# Patient Record
Sex: Male | Born: 1945 | Race: Black or African American | Hispanic: No | Smoking: Former smoker
Health system: Southern US, Community
[De-identification: ages and names within clinical notes are randomized; demographics above are authoritative.]

## PROBLEM LIST (undated history)

## (undated) DIAGNOSIS — I4891 Unspecified atrial fibrillation: Secondary | ICD-10-CM

## (undated) DIAGNOSIS — I10 Essential (primary) hypertension: Secondary | ICD-10-CM

## (undated) DIAGNOSIS — I639 Cerebral infarction, unspecified: Secondary | ICD-10-CM

## (undated) DIAGNOSIS — I509 Heart failure, unspecified: Secondary | ICD-10-CM

---

## 2019-11-23 ENCOUNTER — Other Ambulatory Visit: Payer: Self-pay

## 2019-11-23 ENCOUNTER — Inpatient Hospital Stay
Admission: EM | Admit: 2019-11-23 | Discharge: 2019-11-25 | DRG: 291 | Discharge status: Against Medical Advice | Payer: Medicare Other | Attending: Internal Medicine | Admitting: Internal Medicine

## 2019-11-23 ENCOUNTER — Encounter: Payer: Self-pay | Admitting: Emergency Medicine

## 2019-11-23 ENCOUNTER — Emergency Department: Payer: Medicare Other

## 2019-11-23 DIAGNOSIS — Z880 Allergy status to penicillin: Secondary | ICD-10-CM

## 2019-11-23 DIAGNOSIS — I428 Other cardiomyopathies: Secondary | ICD-10-CM | POA: Diagnosis present

## 2019-11-23 DIAGNOSIS — E859 Amyloidosis, unspecified: Secondary | ICD-10-CM | POA: Diagnosis present

## 2019-11-23 DIAGNOSIS — Z7901 Long term (current) use of anticoagulants: Secondary | ICD-10-CM

## 2019-11-23 DIAGNOSIS — Z5329 Procedure and treatment not carried out because of patient's decision for other reasons: Secondary | ICD-10-CM | POA: Diagnosis not present

## 2019-11-23 DIAGNOSIS — E876 Hypokalemia: Secondary | ICD-10-CM

## 2019-11-23 DIAGNOSIS — R778 Other specified abnormalities of plasma proteins: Secondary | ICD-10-CM | POA: Diagnosis present

## 2019-11-23 DIAGNOSIS — Z87891 Personal history of nicotine dependence: Secondary | ICD-10-CM | POA: Diagnosis not present

## 2019-11-23 DIAGNOSIS — I509 Heart failure, unspecified: Secondary | ICD-10-CM

## 2019-11-23 DIAGNOSIS — Z20822 Contact with and (suspected) exposure to covid-19: Secondary | ICD-10-CM | POA: Diagnosis present

## 2019-11-23 DIAGNOSIS — Z8673 Personal history of transient ischemic attack (TIA), and cerebral infarction without residual deficits: Secondary | ICD-10-CM

## 2019-11-23 DIAGNOSIS — I712 Thoracic aortic aneurysm, without rupture: Secondary | ICD-10-CM | POA: Diagnosis present

## 2019-11-23 DIAGNOSIS — I110 Hypertensive heart disease with heart failure: Secondary | ICD-10-CM | POA: Diagnosis present

## 2019-11-23 DIAGNOSIS — I5023 Acute on chronic systolic (congestive) heart failure: Secondary | ICD-10-CM

## 2019-11-23 DIAGNOSIS — J9601 Acute respiratory failure with hypoxia: Secondary | ICD-10-CM | POA: Diagnosis present

## 2019-11-23 DIAGNOSIS — I482 Chronic atrial fibrillation, unspecified: Secondary | ICD-10-CM | POA: Diagnosis present

## 2019-11-23 DIAGNOSIS — I10 Essential (primary) hypertension: Secondary | ICD-10-CM

## 2019-11-23 DIAGNOSIS — M7989 Other specified soft tissue disorders: Secondary | ICD-10-CM

## 2019-11-23 DIAGNOSIS — R601 Generalized edema: Secondary | ICD-10-CM

## 2019-11-23 DIAGNOSIS — I7121 Aneurysm of the ascending aorta, without rupture: Secondary | ICD-10-CM

## 2019-11-23 HISTORY — DX: Cerebral infarction, unspecified: I63.9

## 2019-11-23 HISTORY — DX: Unspecified atrial fibrillation: I48.91

## 2019-11-23 HISTORY — DX: Essential (primary) hypertension: I10

## 2019-11-23 HISTORY — DX: Heart failure, unspecified: I50.9

## 2019-11-23 LAB — BASIC METABOLIC PANEL
Anion gap: 9 (ref 5–15)
BUN: 20 mg/dL (ref 8–23)
CO2: 27 mmol/L (ref 22–32)
Calcium: 8.7 mg/dL — ABNORMAL LOW (ref 8.9–10.3)
Chloride: 108 mmol/L (ref 98–111)
Creatinine, Ser: 1.17 mg/dL (ref 0.61–1.24)
GFR calc Af Amer: >60 mL/min (ref >60)
GFR calc non Af Amer: >60 mL/min (ref >60)
Glucose, Bld: 113 mg/dL — ABNORMAL HIGH (ref 70–99)
Potassium: 2.8 mmol/L — ABNORMAL LOW (ref 3.5–5.1)
Sodium: 144 mmol/L (ref 135–145)

## 2019-11-23 LAB — HEPATIC FUNCTION PANEL
ALT: 21 U/L (ref 0–44)
AST: 39 U/L (ref 15–41)
Albumin: 3.4 g/dL — ABNORMAL LOW (ref 3.5–5.0)
Alkaline Phosphatase: 105 U/L (ref 38–126)
Bilirubin, Direct: 1.2 mg/dL — ABNORMAL HIGH (ref 0.0–0.2)
Indirect Bilirubin: 1.9 mg/dL — ABNORMAL HIGH (ref 0.3–0.9)
Total Bilirubin: 3.1 mg/dL — ABNORMAL HIGH (ref 0.3–1.2)
Total Protein: 7.2 g/dL (ref 6.5–8.1)

## 2019-11-23 LAB — CBC
HCT: 36.6 % — ABNORMAL LOW (ref 39.0–52.0)
Hemoglobin: 11.4 g/dL — ABNORMAL LOW (ref 13.0–17.0)
MCH: 26.3 pg (ref 26.0–34.0)
MCHC: 31.1 g/dL (ref 30.0–36.0)
MCV: 84.3 fL (ref 80.0–100.0)
Platelets: 205 10*3/uL (ref 150–400)
RBC: 4.34 MIL/uL (ref 4.22–5.81)
RDW: 16.7 % — ABNORMAL HIGH (ref 11.5–15.5)
WBC: 5.6 10*3/uL (ref 4.0–10.5)
nRBC: 0 % (ref 0.0–0.2)

## 2019-11-23 LAB — URINALYSIS, COMPLETE (UACMP) WITH MICROSCOPIC
Bacteria, UA: NONE SEEN
Glucose, UA: NEGATIVE mg/dL
Ketones, ur: NEGATIVE mg/dL
Nitrite: NEGATIVE
Protein, ur: 30 mg/dL — AB
RBC / HPF: >50 RBC/hpf — ABNORMAL HIGH (ref 0–5)
Specific Gravity, Urine: 1.039 — ABNORMAL HIGH (ref 1.005–1.030)
pH: 5 (ref 5.0–8.0)

## 2019-11-23 LAB — TROPONIN I (HIGH SENSITIVITY)
Troponin I (High Sensitivity): 234 ng/L (ref <18)
Troponin I (High Sensitivity): 251 ng/L (ref <18)
Troponin I (High Sensitivity): 251 ng/L (ref <18)

## 2019-11-23 LAB — BRAIN NATRIURETIC PEPTIDE: B Natriuretic Peptide: 809 pg/mL — ABNORMAL HIGH (ref 0.0–100.0)

## 2019-11-23 LAB — PROTIME-INR
INR: 1 (ref 0.8–1.2)
Prothrombin Time: 12.6 seconds (ref 11.4–15.2)

## 2019-11-23 LAB — TSH: TSH: 3.656 u[IU]/mL (ref 0.350–4.500)

## 2019-11-23 MED ORDER — SODIUM CHLORIDE 0.9% FLUSH
3.0000 mL | Freq: Two times a day (BID) | INTRAVENOUS | Status: DC
  Administered 2019-11-23 – 2019-11-25 (×4): 3 mL via INTRAVENOUS

## 2019-11-23 MED ORDER — SODIUM CHLORIDE 0.9% FLUSH: 3.0000 mL | INTRAVENOUS | Status: DC | PRN

## 2019-11-23 MED ORDER — ONDANSETRON HCL 4 MG/2ML IJ SOLN: 4.0000 mg | Freq: Four times a day (QID) | INTRAMUSCULAR | Status: DC | PRN

## 2019-11-23 MED ORDER — SODIUM CHLORIDE 0.9 % IV SOLN: INTRAVENOUS | Status: DC | PRN

## 2019-11-23 MED ORDER — SODIUM CHLORIDE 0.9 % IV SOLN: 250.0000 mL | INTRAVENOUS | Status: DC | PRN

## 2019-11-23 MED ORDER — POTASSIUM CHLORIDE CRYS ER 20 MEQ PO TBCR
40.0000 meq | EXTENDED_RELEASE_TABLET | Freq: Two times a day (BID) | ORAL | Status: DC
  Administered 2019-11-23 – 2019-11-25 (×3): 40 meq via ORAL
  Filled 2019-11-23 (×4): qty 2

## 2019-11-23 MED ORDER — SODIUM CHLORIDE 0.9 % IV SOLN: Freq: Once | INTRAVENOUS | Status: DC

## 2019-11-23 MED ORDER — ACETAMINOPHEN 325 MG PO TABS: 650.0000 mg | ORAL_TABLET | ORAL | Status: DC | PRN

## 2019-11-23 MED ORDER — FUROSEMIDE 10 MG/ML IJ SOLN
40.0000 mg | Freq: Once | INTRAMUSCULAR | Status: AC
  Administered 2019-11-23: 40 mg via INTRAVENOUS
  Filled 2019-11-23: qty 4

## 2019-11-23 MED ORDER — ASPIRIN 81 MG PO CHEW
324.0000 mg | CHEWABLE_TABLET | Freq: Once | ORAL | Status: AC
  Administered 2019-11-23: 18:00:00 324 mg via ORAL
  Filled 2019-11-23: qty 4

## 2019-11-23 MED ORDER — IOHEXOL 350 MG/ML SOLN
125.0000 mL | Freq: Once | INTRAVENOUS | Status: AC | PRN
  Administered 2019-11-23: 125 mL via INTRAVENOUS

## 2019-11-23 MED ORDER — POTASSIUM CHLORIDE CRYS ER 20 MEQ PO TBCR
40.0000 meq | EXTENDED_RELEASE_TABLET | Freq: Once | ORAL | Status: AC
  Administered 2019-11-23: 40 meq via ORAL
  Filled 2019-11-23: qty 2

## 2019-11-23 MED ORDER — POTASSIUM CHLORIDE 10 MEQ/100ML IV SOLN
10.0000 meq | INTRAVENOUS | Status: AC
  Administered 2019-11-23 (×3): 10 meq via INTRAVENOUS
  Filled 2019-11-23 (×3): qty 100

## 2019-11-23 MED ORDER — FUROSEMIDE 10 MG/ML IJ SOLN
60.0000 mg | Freq: Two times a day (BID) | INTRAMUSCULAR | Status: DC
  Administered 2019-11-24 – 2019-11-25 (×3): 60 mg via INTRAVENOUS
  Filled 2019-11-23 (×3): qty 6

## 2019-11-23 NOTE — ED Notes (Signed)
Transported to CT 

## 2019-11-23 NOTE — ED Notes (Signed)
Pt reports hearing voices that are loud to RN.  Asking if RN hearing same voices.  Reports has been hearing voices that no one else hears.  Dr Silverio Lay notified.

## 2019-11-23 NOTE — ED Triage Notes (Addendum)
Pt arrived via POV with reports of right leg pain, states "it gives out".  Pt also reports having excess fluid build-up as well and wants evaluated. Pt denies any shortness of breath.   Pt recently admitted for Anasarca, states he still has scrotal swelling, but states the swelling has subsided.

## 2019-11-23 NOTE — H&P (Signed)
History and Physical    Franklin Obrien ZJQ:[id number] DOB: [birth year] DOA: 11/23/2019  PCP: Patient, No Pcp Per   Patient coming from: home I have personally briefly reviewed patient's old medical records in [place]  Chief Complaint: fluid build up  HPI: Franklin Obrien is a 74 y.o. 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 fib on Eliquis, with history of multiple hospitalizations for fluid overload/anasarca at [facility], most recently February 2021 with EF 35 to 40% with elevated troponin around 200, treated as demand ischemia from heart failure exacerbation, who presents to the emergency room with a complaint of increased swelling in the right arm and legs. chartreview reveals that it has been a recurrent problem for patient and has been attributed to his fluid buildup.  He denies orthopnea and is lying flat comfortably.  Patient denies shortness of breath, denies chest pain, denies nausea vomiting, palpitations lightheadedness  ED Course: On arrival in the emergency room he was afebrile with BP 133/82, HR 82, O2 sat 98% on room air.  Troponin was elevated at 234>>251 (recently at [facility]200>>240), BNP elevated at 809, indirect bili 1.9.  Potassium 2.8.-0.4.  Blood work otherwise unremarkable.  Urinalysis not consistent with UTI.  Chest x-ray showed cardiomegaly with pulmonary vascular congestion as Doppler of the right upper extremity that was negative for DVT.  CT head showed no acute intracranial findings.  He had a CT angiogram of the aorta that showed diffuse anasarca with small bilateral pleural effusions, normal caliber abdominal aorta without aneurysm or dissection.  Patient was treated with IV Lasix.  Hospitalist consulted for admission  Review of Systems: As per HPI otherwise 10 point review of systems negative.    Past Medical History:  Diagnosis Date  . A-fib ([place])    . CHF (congestive heart failure) ([place])   . Hypertension   . Stroke [facility])     History as mentioned in HPI   reports that he has quit smoking. He has never used smokeless tobacco. No history on file for alcohol and drug.  Allergies  Allergen Reactions  . Doxycycline     Other reaction(s): Other (See Comments) Syncope and collapse  . Penicillin G Rash    No family history on file.   Prior to Admission medications   Not on File    Physical Exam: Vitals:   11/23/19 1538 11/23/19 1609 11/23/19 1800 11/23/19 1830  BP: 133/82  109/62 127/73  Pulse: 82   80  Resp: 16  (!) 24 (!) 25  Temp: 97.8 F (36.6 C)     TempSrc: Oral     SpO2: 98%  97% 100%  Weight:  108.9 kg    Height:  5\' 8"  (1.727 m)       Vitals:   11/23/19 1538 11/23/19 1609 11/23/19 1800 11/23/19 1830  BP: 133/82  109/62 127/73  Pulse: 82   80  Resp: 16  (!) 24 (!) 25  Temp: 97.8 F (36.6 C)     TempSrc: Oral     SpO2: 98%  97% 100%  Weight:  108.9 kg    Height:  5\' 8"  (1.727 m)      Constitutional: NAD, alert and oriented x 3 Eyes: PERRL, lids and conjunctivae normal ENMT: Mucous membranes are moist.  Neck: normal, supple, no masses, no thyromegaly Respiratory: Few rales bilateral bases. Normal respiratory effort. No accessory muscle use.  Cardiovascular: Regular rate and  rhythm, no murmurs / rubs / gallops.  3+ pitting edema right upper extremity.  1-2+ pitting edema lower extremities. 2+ pedal pulses. No carotid bruits.  Abdomen: no tenderness, no masses palpated. No hepatosplenomegaly. Bowel sounds positive.  Musculoskeletal: no clubbing / cyanosis. No joint deformity upper and lower extremities.  Skin: no rashes, lesions, ulcers.  Neurologic: No gross focal neurologic deficit. Psychiatric: Normal mood and affect.   Labs on Admission: I have personally reviewed following labs and imaging studies  CBC: Recent Labs  Lab 11/23/19 1615  WBC 5.6  HGB 11.4*  HCT 36.6*  MCV 84.3  PLT 205     Basic Metabolic Panel: Recent Labs  Lab 11/23/19 1615  NA 144  K 2.8*  CL 108  CO2 27  GLUCOSE 113*  BUN 20  CREATININE 1.17  CALCIUM 8.7*   GFR: Estimated Creatinine Clearance: 67.3 mL/min (by C-G formula based on SCr of 1.17 mg/dL). Liver Function Tests: Recent Labs  Lab 11/23/19 1740  AST 39  ALT 21  ALKPHOS 105  BILITOT 3.1*  PROT 7.2  ALBUMIN 3.4*   No results for input(s): LIPASE, AMYLASE in the last 168 hours. No results for input(s): AMMONIA in the last 168 hours. Coagulation Profile: Recent Labs  Lab 11/23/19 1740  INR 1.0   Cardiac Enzymes: No results for input(s): CKTOTAL, CKMB, CKMBINDEX, TROPONINI in the last 168 hours. BNP (last 3 results) No results for input(s): PROBNP in the last 8760 hours. HbA1C: No results for input(s): HGBA1C in the last 72 hours. CBG: No results for input(s): GLUCAP in the last 168 hours. Lipid Profile: No results for input(s): CHOL, HDL, LDLCALC, TRIG, CHOLHDL, LDLDIRECT in the last 72 hours. Thyroid Function Tests: No results for input(s): TSH, T4TOTAL, FREET4, T3FREE, THYROIDAB in the last 72 hours. Anemia Panel: No results for input(s): VITAMINB12, FOLATE, FERRITIN, TIBC, IRON, RETICCTPCT in the last 72 hours. Urine analysis:    Component Value Date/Time   COLORURINE AMBER (A) 11/23/2019 1957   APPEARANCEUR HAZY (A) 11/23/2019 1957   LABSPEC 1.039 (H) 11/23/2019 1957   PHURINE 5.0 11/23/2019 1957   GLUCOSEU NEGATIVE 11/23/2019 1957   HGBUR LARGE (A) 11/23/2019 1957   BILIRUBINUR SMALL (A) 11/23/2019 1957   KETONESUR NEGATIVE 11/23/2019 1957   PROTEINUR 30 (A) 11/23/2019 1957   NITRITE NEGATIVE 11/23/2019 1957   LEUKOCYTESUR SMALL (A) 11/23/2019 1957    Radiological Exams on Admission: DG Chest 2 View  Result Date: 11/23/2019 CLINICAL DATA:  74 year old male with shortness of breath. EXAM: CHEST - 2 VIEW COMPARISON:  11/06/2019 chest radiograph FINDINGS: Cardiomegaly and pulmonary vascular congestion  noted. There is no evidence of focal airspace disease, pulmonary edema, suspicious pulmonary nodule/mass, pleural effusion, or pneumothorax. No acute bony abnormalities are identified. IMPRESSION: Cardiomegaly with pulmonary vascular congestion. Electronically Signed   By: Harmon Pier M.D.   On: 11/23/2019 17:23   CT Head Wo Contrast  Result Date: 11/23/2019 CLINICAL DATA:  Hearing voices, question of TIA EXAM: CT HEAD WITHOUT CONTRAST TECHNIQUE: Contiguous axial images were obtained from the base of the skull through the vertex without intravenous contrast. COMPARISON:  None. FINDINGS: Brain: No evidence of acute territorial infarction, hemorrhage, hydrocephalus,extra-axial collection or mass lesion/mass effect. There is dilatation the ventricles and sulci consistent with age-related atrophy. Low-attenuation changes in the deep white matter consistent with small vessel ischemia. Vascular: No hyperdense vessel or unexpected calcification. Skull: The skull is intact. No fracture or focal lesion identified. Sinuses/Orbits: The visualized paranasal sinuses and mastoid air cells  are clear. The orbits and globes intact. Other: Question of mild soft tissue swelling seen over the left frontal lobe. IMPRESSION: No acute intracranial abnormality. Findings consistent with age related atrophy and chronic small vessel ischemia Electronically Signed   By: Jonna Clark M.D.   On: 11/23/2019 19:34   CT Angio Aortobifemoral W and/or Wo Contrast  Result Date: 11/23/2019 CLINICAL DATA:  Right leg pain, short of breath, anasarca EXAM: CT ANGIOGRAPHY OF ABDOMINAL AORTA WITH ILIOFEMORAL RUNOFF TECHNIQUE: Multidetector CT imaging of the abdomen, pelvis and lower extremities was performed using the standard protocol during bolus administration of intravenous contrast. Multiplanar CT image reconstructions and MIPs were obtained to evaluate the vascular anatomy. CONTRAST:  OMNIPAQUE IOHEXOL 350 MG/ML SOLN COMPARISON:  None.  FINDINGS: VASCULAR Aorta: Normal caliber aorta without aneurysm, dissection, vasculitis or significant stenosis. Celiac: Patent without evidence of aneurysm, dissection, vasculitis or significant stenosis. SMA: Patent without evidence of aneurysm, dissection, vasculitis or significant stenosis. Renals: Both renal arteries are patent without evidence of aneurysm, dissection, vasculitis, fibromuscular dysplasia or significant stenosis. IMA: Patent without evidence of aneurysm, dissection, vasculitis or significant stenosis. RIGHT Lower Extremity Inflow: Common, internal and external iliac arteries are patent without evidence of aneurysm, dissection, vasculitis or significant stenosis. Mild atheromatous plaque at the iliac bifurcation. Outflow: Common, superficial and profunda femoral arteries and the popliteal artery are patent without evidence of aneurysm, dissection, vasculitis or significant stenosis. Runoff: The vessels distal to the trifurcation are not adequately evaluated due to incomplete opacification, likely due to timing of the contrast bolus. LEFT Lower Extremity Inflow: Common, internal and external iliac arteries are patent without evidence of aneurysm, dissection, vasculitis or significant stenosis. Outflow: Common, superficial and profunda femoral arteries and the popliteal artery are patent without evidence of aneurysm, dissection, vasculitis or significant stenosis. Runoff: The proximal aspect of the trifurcation vessels are widely patent. There is mild calcified atheromatous plaque within the tibioperoneal trunk. Just proximal to the ankle, evaluation of the distal runoff vessels is nondiagnostic due to incomplete opacification likely due to timing of contrast bolus. Veins: No obvious venous abnormality within the limitations of this arterial phase study. Review of the MIP images confirms the above findings. NON-VASCULAR Lower chest: There are small bilateral pleural effusions. Otherwise the lung  bases are clear. The heart is enlarged. No pericardial effusion. Severe right atrial dilatation. Hepatobiliary: Reflux of contrast into the hepatic veins consistent with cardiac dysfunction. Otherwise the liver is unremarkable. No evidence of cholelithiasis or cholecystitis. Pancreas: Unremarkable. No pancreatic ductal dilatation or surrounding inflammatory changes. Spleen: Normal in size without focal abnormality. Adrenals/Urinary Tract: Adrenal glands are unremarkable. Kidneys are normal, without renal calculi, focal lesion, or hydronephrosis. Bladder is unremarkable. Stomach/Bowel: No bowel obstruction or ileus. Normal appendix right lower quadrant. No wall thickening or inflammatory changes. Lymphatic: No pathologic adenopathy within the abdomen or pelvis. Reproductive: Prostate is mildly enlarged measuring approximately 5.0 x 5.2 cm. Other: There is diffuse anasarca. No significant ascites. There is a small umbilical hernia containing a portion of small bowel. No evidence of strangulation or incarceration. Musculoskeletal: No acute or destructive bony lesions. There is mild diffuse osteoarthritis throughout the bilateral lower extremities, most pronounced within the medial compartment of the bilateral knees. Reconstructed images demonstrate no additional findings. IMPRESSION: VASCULAR 1. Normal caliber abdominal aorta without aneurysm or dissection. 2. Widely patent proximal aspect of the trifurcation vessels bilaterally. The distal runoff vessels are not adequately evaluated due to incomplete opacification, likely due to timing of the contrast bolus. NON-VASCULAR  1. Diffuse anasarca with small bilateral pleural effusions. 2. Cardiomegaly with severe right atrial dilatation and reflux of contrast into the hepatic veins suggestive of cardiac dysfunction. 3. Mildly enlarged prostate. 4. Small umbilical hernia containing a portion of small bowel. No evidence of strangulation or incarceration. 5. Mild diffuse  osteoarthritis throughout the bilateral lower extremities, most pronounced within the medial compartment of the bilateral knees. Electronically Signed   By: Sharlet Salina M.D.   On: 11/23/2019 20:05   US Venous Img Upper Uni Right(DVT)  Result Date: 11/23/2019 CLINICAL DATA:  Right upper extremity swelling EXAM: RIGHT UPPER EXTREMITY VENOUS DOPPLER ULTRASOUND TECHNIQUE: Gray-scale sonography with graded compression, as well as color Doppler and duplex ultrasound were performed to evaluate the upper extremity deep venous system from the level of the subclavian vein and including the jugular, axillary, basilic, radial, ulnar and upper cephalic vein. Spectral Doppler was utilized to evaluate flow at rest and with distal augmentation maneuvers. COMPARISON:  None. FINDINGS: Contralateral Subclavian Vein: Respiratory phasicity is normal and symmetric with the symptomatic side. No evidence of thrombus. Normal compressibility. Internal Jugular Vein: No evidence of thrombus. Normal compressibility, respiratory phasicity and response to augmentation. Subclavian Vein: No evidence of thrombus. Normal compressibility, respiratory phasicity and response to augmentation. Axillary Vein: No evidence of thrombus. Normal compressibility, respiratory phasicity and response to augmentation. Cephalic Vein: No evidence of thrombus. Normal compressibility, respiratory phasicity and response to augmentation. Basilic Vein: No evidence of thrombus. Normal compressibility, respiratory phasicity and response to augmentation. Brachial Veins: No evidence of thrombus. Normal compressibility, respiratory phasicity and response to augmentation. Radial Veins: No evidence of thrombus. Normal compressibility, respiratory phasicity and response to augmentation. Ulnar Veins: No evidence of thrombus. Normal compressibility, respiratory phasicity and response to augmentation. Venous Reflux:  None visualized. Other Findings: Nonspecific right upper  extremity swelling is noted. IMPRESSION: No evidence of DVT within the right upper extremity. Nonspecific right upper extremity edema is noted. Electronically Signed   By: Katherine Mantle M.D.   On: 11/23/2019 18:44    EKG: Independently reviewed.   Assessment/Plan Principal Problem:   Acute on chronic systolic CHF (congestive heart failure) (HCC)   Non-ischemic cardiomyopathy (HCC) TRR amyloidosis   elevated troponin, suspect demand ischemia   Anasarca -Patient with a history of multiple hospitalizations with fluid overload, chart reviewed revealing problems with compliance with medication -IV Lasix 60 mg IV twice daily -Troponin elevation 234>>251 appears to be baseline compared to recent hospitalization.  In the absence of chest pain and acute EKG changes, suspect demand ischemia related to heart failure exacerbation -Last echocardiogram February 2021 at [facility] showed EF 35 to 40%. -No beta-blocker secondary to amyloidosis -Plan to restart home ACE/ARB pending med rec -Daily weights, input output monitoring, low-sodium diet -   Hypokalemia -IV and oral repletion.  Continue to monitor    Atrial fibrillation, chronic (HCC) -Not currently on beta-blockers -Continue Eliquis pending med rec    History of CVA (cerebrovascular accident) without residual deficits -To continue antiplatelet and statin if on med rec pending review    Essential hypertension -Pending med review at this time to restart antihypertensive    Aneurysm of ascending aorta (HCC) -Review of records from [facility] showing last diameter was 4.5 in August 2020 with plan for 75-month reevaluation -No acute concerns at this time        DVT prophylaxis: lovenox  Code Status: full code  Family Communication: none  Disposition Plan: Back to previous home environment Consults called: none  Andris Baumann MD [facility]     11/23/2019, 8:51 PM

## 2019-11-23 NOTE — ED Provider Notes (Signed)
[facility] EMERGENCY DEPARTMENT Provider Note   CSN: [id number] Arrival date & time: 11/23/19  1531     History Chief Complaint  Patient presents with  . Leg Pain  . Leg Swelling  . Congestive Heart Failure    Franklin Obrien is a 74 y.o. 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  Patient was recently admitted to [facility] about 3 weeks ago for heart failure.  He was noted to have demand ischemia as well and had echo that showed EF of 35-40%.  Patient was diuresed in the hospital and discharged on torsemide.  Patient states that he has been compliant with his meds.  He states that for the last several weeks, he noticed progressive swelling of the right arm as well as the right leg.  He states that his right leg gives out and feels weak.  He also states that his right leg feels cold and numb as well.  Denies any back pain.  Denies any worsening shortness of breath.  Patient states that he does occasionally have some visual hallucinations but denies any thoughts of harming himself or others.  The history is provided by the patient.       Past Medical History:  Diagnosis Date  . A-fib (HCC)   . CHF (congestive heart failure) (HCC)   . Hypertension   . Stroke [facility])     Patient Active Problem List   Diagnosis Date Noted  . Anasarca 11/23/2019  . Acute on chronic systolic CHF (congestive heart failure) (HCC) 11/23/2019  . Atrial fibrillation, chronic (HCC) 11/23/2019  . Non-ischemic cardiomyopathy (HCC) 11/23/2019  . History of CVA (cerebrovascular accident) without residual deficits 11/23/2019  . Essential hypertension 11/23/2019  . Hypokalemia 11/23/2019  . Elevated troponin 11/23/2019  . Aneurysm of ascending aorta (HCC) 11/23/2019       No family history on file.  Social History   Tobacco Use  . Smoking status: Former Games developer  . Smokeless tobacco:  Never Used  Substance Use Topics  . Alcohol use: Not on file  . Drug use: Not on file    Home Medications Prior to Admission medications   Not on File    Allergies    Doxycycline and Penicillin g  Review of Systems   Review of Systems  Cardiovascular: Positive for leg swelling.  Neurological: Positive for weakness.  All other systems reviewed and are negative.   Physical Exam Updated Vital Signs BP 127/73   Pulse 80   Temp 97.8 F (36.6 C) (Oral)   Resp (!) 25   Ht 5\' 8"  (1.727 m)   Wt 108.9 kg   SpO2 100%   BMI 36.49 kg/m   Physical Exam Vitals and nursing note reviewed.  HENT:     Head: Normocephalic.     Nose: Nose normal.     Mouth/Throat:     Mouth: Mucous membranes are moist.  Eyes:     Extraocular Movements: Extraocular movements intact.     Pupils: Pupils are equal, round, and reactive to light.  Cardiovascular:     Rate and Rhythm: Normal rate and regular rhythm.     Pulses: Normal pulses.  Pulmonary:     Effort: Pulmonary effort is normal.     Comments: Diminished bilateral bases  Abdominal:     General: Abdomen is flat.     Palpations: Abdomen is soft.  Musculoskeletal:     Cervical  back: Normal range of motion.     Comments: R arm swollen but nl radial pulse.  Bilateral leg edema and both feet are cold.  Right foot is especially cold and has poor capillary refill.  I was unable to palpate dorsalis pedis pulse bilaterally but able to get a dopplerable pulse left DP and PT. Unable to get dopplerable pulse in the right DP but able to get a faint dopplerable pulse in the right PT. he has good palpable pulses at the knee    Skin:    Capillary Refill: Capillary refill takes less than 2 seconds.  Neurological:     General: No focal deficit present.     Mental Status: He is alert.  Psychiatric:        Mood and Affect: Mood normal.        Behavior: Behavior normal.     ED Results / Procedures / Treatments   Labs (all labs ordered are listed, but  only abnormal results are displayed) Labs Reviewed  BASIC METABOLIC PANEL - Abnormal; Notable for the following components:      Result Value   Potassium 2.8 (*)    Glucose, Bld 113 (*)    Calcium 8.7 (*)    All other components within normal limits  CBC - Abnormal; Notable for the following components:   Hemoglobin 11.4 (*)    HCT 36.6 (*)    RDW 16.7 (*)    All other components within normal limits  BRAIN NATRIURETIC PEPTIDE - Abnormal; Notable for the following components:   B Natriuretic Peptide 809.0 (*)    All other components within normal limits  URINALYSIS, COMPLETE (UACMP) WITH MICROSCOPIC - Abnormal; Notable for the following components:   Color, Urine AMBER (*)    APPearance HAZY (*)    Specific Gravity, Urine 1.039 (*)    Hgb urine dipstick LARGE (*)    Bilirubin Urine SMALL (*)    Protein, ur 30 (*)    Leukocytes,Ua SMALL (*)    RBC / HPF >50 (*)    All other components within normal limits  HEPATIC FUNCTION PANEL - Abnormal; Notable for the following components:   Albumin 3.4 (*)    Total Bilirubin 3.1 (*)    Bilirubin, Direct 1.2 (*)    Indirect Bilirubin 1.9 (*)    All other components within normal limits  TROPONIN I (HIGH SENSITIVITY) - Abnormal; Notable for the following components:   Troponin I (High Sensitivity) 234 (*)    All other components within normal limits  TROPONIN I (HIGH SENSITIVITY) - Abnormal; Notable for the following components:   Troponin I (High Sensitivity) 251 (*)    All other components within normal limits  SARS CORONAVIRUS 2 (TAT 6-24 HRS)  PROTIME-INR  TROPONIN I (HIGH SENSITIVITY)    EKG EKG Interpretation  Date/Time:  Saturday November 23 2019 16:24:04 EST Ventricular Rate:  89 PR Interval:  168 QRS Duration: 82 QT Interval:  332 QTC Calculation: 403 R Axis:   169 Text Interpretation: Normal sinus rhythm Possible Right ventricular hypertrophy Possible Anterolateral infarct , age undetermined Abnormal ECG No previous  ECGs available Confirmed by Richardean Canal [phone]) on 11/23/2019 5:21:21 PM   Radiology DG Chest 2 View  Result Date: 11/23/2019 CLINICAL DATA:  74 year old male with shortness of breath. EXAM: CHEST - 2 VIEW COMPARISON:  11/06/2019 chest radiograph FINDINGS: Cardiomegaly and pulmonary vascular congestion noted. There is no evidence of focal airspace disease, pulmonary edema, suspicious pulmonary nodule/mass, pleural effusion, or  pneumothorax. No acute bony abnormalities are identified. IMPRESSION: Cardiomegaly with pulmonary vascular congestion. Electronically Signed   By: Harmon Pier M.D.   On: 11/23/2019 17:23   CT Head Wo Contrast  Result Date: 11/23/2019 CLINICAL DATA:  Hearing voices, question of TIA EXAM: CT HEAD WITHOUT CONTRAST TECHNIQUE: Contiguous axial images were obtained from the base of the skull through the vertex without intravenous contrast. COMPARISON:  None. FINDINGS: Brain: No evidence of acute territorial infarction, hemorrhage, hydrocephalus,extra-axial collection or mass lesion/mass effect. There is dilatation the ventricles and sulci consistent with age-related atrophy. Low-attenuation changes in the deep white matter consistent with small vessel ischemia. Vascular: No hyperdense vessel or unexpected calcification. Skull: The skull is intact. No fracture or focal lesion identified. Sinuses/Orbits: The visualized paranasal sinuses and mastoid air cells are clear. The orbits and globes intact. Other: Question of mild soft tissue swelling seen over the left frontal lobe. IMPRESSION: No acute intracranial abnormality. Findings consistent with age related atrophy and chronic small vessel ischemia Electronically Signed   By: Jonna Clark M.D.   On: 11/23/2019 19:34   CT Angio Aortobifemoral W and/or Wo Contrast  Result Date: 11/23/2019 CLINICAL DATA:  Right leg pain, short of breath, anasarca EXAM: CT ANGIOGRAPHY OF ABDOMINAL AORTA WITH ILIOFEMORAL RUNOFF TECHNIQUE: Multidetector CT  imaging of the abdomen, pelvis and lower extremities was performed using the standard protocol during bolus administration of intravenous contrast. Multiplanar CT image reconstructions and MIPs were obtained to evaluate the vascular anatomy. CONTRAST:  OMNIPAQUE IOHEXOL 350 MG/ML SOLN COMPARISON:  None. FINDINGS: VASCULAR Aorta: Normal caliber aorta without aneurysm, dissection, vasculitis or significant stenosis. Celiac: Patent without evidence of aneurysm, dissection, vasculitis or significant stenosis. SMA: Patent without evidence of aneurysm, dissection, vasculitis or significant stenosis. Renals: Both renal arteries are patent without evidence of aneurysm, dissection, vasculitis, fibromuscular dysplasia or significant stenosis. IMA: Patent without evidence of aneurysm, dissection, vasculitis or significant stenosis. RIGHT Lower Extremity Inflow: Common, internal and external iliac arteries are patent without evidence of aneurysm, dissection, vasculitis or significant stenosis. Mild atheromatous plaque at the iliac bifurcation. Outflow: Common, superficial and profunda femoral arteries and the popliteal artery are patent without evidence of aneurysm, dissection, vasculitis or significant stenosis. Runoff: The vessels distal to the trifurcation are not adequately evaluated due to incomplete opacification, likely due to timing of the contrast bolus. LEFT Lower Extremity Inflow: Common, internal and external iliac arteries are patent without evidence of aneurysm, dissection, vasculitis or significant stenosis. Outflow: Common, superficial and profunda femoral arteries and the popliteal artery are patent without evidence of aneurysm, dissection, vasculitis or significant stenosis. Runoff: The proximal aspect of the trifurcation vessels are widely patent. There is mild calcified atheromatous plaque within the tibioperoneal trunk. Just proximal to the ankle, evaluation of the distal runoff vessels is  nondiagnostic due to incomplete opacification likely due to timing of contrast bolus. Veins: No obvious venous abnormality within the limitations of this arterial phase study. Review of the MIP images confirms the above findings. NON-VASCULAR Lower chest: There are small bilateral pleural effusions. Otherwise the lung bases are clear. The heart is enlarged. No pericardial effusion. Severe right atrial dilatation. Hepatobiliary: Reflux of contrast into the hepatic veins consistent with cardiac dysfunction. Otherwise the liver is unremarkable. No evidence of cholelithiasis or cholecystitis. Pancreas: Unremarkable. No pancreatic ductal dilatation or surrounding inflammatory changes. Spleen: Normal in size without focal abnormality. Adrenals/Urinary Tract: Adrenal glands are unremarkable. Kidneys are normal, without renal calculi, focal lesion, or hydronephrosis. Bladder is unremarkable. Stomach/Bowel: No  bowel obstruction or ileus. Normal appendix right lower quadrant. No wall thickening or inflammatory changes. Lymphatic: No pathologic adenopathy within the abdomen or pelvis. Reproductive: Prostate is mildly enlarged measuring approximately 5.0 x 5.2 cm. Other: There is diffuse anasarca. No significant ascites. There is a small umbilical hernia containing a portion of small bowel. No evidence of strangulation or incarceration. Musculoskeletal: No acute or destructive bony lesions. There is mild diffuse osteoarthritis throughout the bilateral lower extremities, most pronounced within the medial compartment of the bilateral knees. Reconstructed images demonstrate no additional findings. IMPRESSION: VASCULAR 1. Normal caliber abdominal aorta without aneurysm or dissection. 2. Widely patent proximal aspect of the trifurcation vessels bilaterally. The distal runoff vessels are not adequately evaluated due to incomplete opacification, likely due to timing of the contrast bolus. NON-VASCULAR 1. Diffuse anasarca with small  bilateral pleural effusions. 2. Cardiomegaly with severe right atrial dilatation and reflux of contrast into the hepatic veins suggestive of cardiac dysfunction. 3. Mildly enlarged prostate. 4. Small umbilical hernia containing a portion of small bowel. No evidence of strangulation or incarceration. 5. Mild diffuse osteoarthritis throughout the bilateral lower extremities, most pronounced within the medial compartment of the bilateral knees. Electronically Signed   By: Randa Ngo M.D.   On: 11/23/2019 20:05   US Venous Img Upper Uni Right(DVT)  Result Date: 11/23/2019 CLINICAL DATA:  Right upper extremity swelling EXAM: RIGHT UPPER EXTREMITY VENOUS DOPPLER ULTRASOUND TECHNIQUE: Gray-scale sonography with graded compression, as well as color Doppler and duplex ultrasound were performed to evaluate the upper extremity deep venous system from the level of the subclavian vein and including the jugular, axillary, basilic, radial, ulnar and upper cephalic vein. Spectral Doppler was utilized to evaluate flow at rest and with distal augmentation maneuvers. COMPARISON:  None. FINDINGS: Contralateral Subclavian Vein: Respiratory phasicity is normal and symmetric with the symptomatic side. No evidence of thrombus. Normal compressibility. Internal Jugular Vein: No evidence of thrombus. Normal compressibility, respiratory phasicity and response to augmentation. Subclavian Vein: No evidence of thrombus. Normal compressibility, respiratory phasicity and response to augmentation. Axillary Vein: No evidence of thrombus. Normal compressibility, respiratory phasicity and response to augmentation. Cephalic Vein: No evidence of thrombus. Normal compressibility, respiratory phasicity and response to augmentation. Basilic Vein: No evidence of thrombus. Normal compressibility, respiratory phasicity and response to augmentation. Brachial Veins: No evidence of thrombus. Normal compressibility, respiratory phasicity and response to  augmentation. Radial Veins: No evidence of thrombus. Normal compressibility, respiratory phasicity and response to augmentation. Ulnar Veins: No evidence of thrombus. Normal compressibility, respiratory phasicity and response to augmentation. Venous Reflux:  None visualized. Other Findings: Nonspecific right upper extremity swelling is noted. IMPRESSION: No evidence of DVT within the right upper extremity. Nonspecific right upper extremity edema is noted. Electronically Signed   By: Constance Holster M.D.   On: 11/23/2019 18:44    Procedures Procedures (including critical care time)  CRITICAL CARE Performed by: Wandra Arthurs   Total critical care time: 30 minutes  Critical care time was exclusive of separately billable procedures and treating other patients.  Critical care was necessary to treat or prevent imminent or life-threatening deterioration.  Critical care was time spent personally by me on the following activities: development of treatment plan with patient and/or surrogate as well as nursing, discussions with consultants, evaluation of patient's response to treatment, examination of patient, obtaining history from patient or surrogate, ordering and performing treatments and interventions, ordering and review of laboratory studies, ordering and review of radiographic studies, pulse oximetry and re-evaluation of  patient's condition.   Medications Ordered in ED Medications  potassium chloride 10 mEq in 100 mL IVPB (10 mEq Intravenous New Bag/Given 11/23/19 2049)  0.9 %  sodium chloride infusion ( Intravenous New Bag/Given 11/23/19 1829)  potassium chloride SA (KLOR-CON) CR tablet 40 mEq (40 mEq Oral Given 11/23/19 1816)  aspirin chewable tablet 324 mg (324 mg Oral Given 11/23/19 1826)  furosemide (LASIX) injection 40 mg (40 mg Intravenous Given 11/23/19 1939)  iohexol (OMNIPAQUE) 350 MG/ML injection 125 mL (125 mLs Intravenous Contrast Given 11/23/19 1907)    ED Course  I have reviewed  the triage vital signs and the nursing notes.  Pertinent labs & imaging results that were available during my care of the patient were reviewed by me and considered in my medical decision making (see chart for details).    MDM Rules/Calculators/A&P                      Franklin Obrien is a 74 y.o. male here presenting with right arm swelling and bilateral leg swelling.  He has poor pulses in the right leg.  I was unable to get any palpable pulses bilaterally but able to get Doppler pulses on the left.  I was able to get right PT pulse with Doppler.  I talked to vascular surgery, Dr. Evie Lacks, who agreed with CT aortobifemoral with run off. He is on eliquis already. Also consider worsening heart failure so will get trop, BNP, CXR.    8:50 PM Trop is 250, repeat is stable. His BNP is 800.  He has demand ischemia as per notes from [facility]. Given IV lasix. Hold off on heparin. CTA showed patent aorta and femoral arteries. Dr. Evie Lacks doesn't recommend any vascular intervention inpatient. Will admit for CHF exacerbation with demand ischemia.   Final Clinical Impression(s) / ED Diagnoses Final diagnoses:  Swelling in right armpit    Rx / DC Orders ED Discharge Orders    None       Charlynne Pander, MD 11/23/19 2053

## 2019-11-24 ENCOUNTER — Encounter: Payer: Self-pay | Admitting: Internal Medicine

## 2019-11-24 ENCOUNTER — Other Ambulatory Visit: Payer: Self-pay

## 2019-11-24 DIAGNOSIS — I5023 Acute on chronic systolic (congestive) heart failure: Secondary | ICD-10-CM

## 2019-11-24 LAB — BASIC METABOLIC PANEL
Anion gap: 10 (ref 5–15)
BUN: 19 mg/dL (ref 8–23)
CO2: 28 mmol/L (ref 22–32)
Calcium: 8.7 mg/dL — ABNORMAL LOW (ref 8.9–10.3)
Chloride: 107 mmol/L (ref 98–111)
Creatinine, Ser: 1.19 mg/dL (ref 0.61–1.24)
GFR calc Af Amer: >60 mL/min (ref >60)
GFR calc non Af Amer: >60 mL/min (ref >60)
Glucose, Bld: 87 mg/dL (ref 70–99)
Potassium: 3.4 mmol/L — ABNORMAL LOW (ref 3.5–5.1)
Sodium: 145 mmol/L (ref 135–145)

## 2019-11-24 LAB — MAGNESIUM: Magnesium: 2.4 mg/dL (ref 1.7–2.4)

## 2019-11-24 LAB — SARS CORONAVIRUS 2 (TAT 6-24 HRS): SARS Coronavirus 2: NEGATIVE

## 2019-11-24 LAB — PROCALCITONIN: Procalcitonin: <0.1 ng/mL

## 2019-11-24 MED ORDER — NITROGLYCERIN 0.4 MG SL SUBL: 0.4000 mg | SUBLINGUAL_TABLET | SUBLINGUAL | Status: DC | PRN

## 2019-11-24 MED ORDER — POTASSIUM CHLORIDE CRYS ER 20 MEQ PO TBCR
40.0000 meq | EXTENDED_RELEASE_TABLET | Freq: Once | ORAL | Status: AC
  Administered 2019-11-24: 17:00:00 40 meq via ORAL
  Filled 2019-11-24: qty 2

## 2019-11-24 MED ORDER — ASPIRIN 81 MG PO CHEW
81.0000 mg | CHEWABLE_TABLET | Freq: Every day | ORAL | Status: DC
  Administered 2019-11-24 – 2019-11-25 (×2): 81 mg via ORAL
  Filled 2019-11-24 (×2): qty 1

## 2019-11-24 MED ORDER — APIXABAN 5 MG PO TABS
5.0000 mg | ORAL_TABLET | Freq: Two times a day (BID) | ORAL | Status: DC
  Filled 2019-11-24 (×3): qty 1

## 2019-11-24 NOTE — Consult Note (Signed)
PHARMACY CONSULT NOTE - FOLLOW UP  Pharmacy Consult for Electrolyte Monitoring and Replacement   Recent Labs: Potassium (mmol/L)  Date Value  11/24/2019 3.4 (L)   Magnesium (mg/dL)  Date Value  70/01/2590 2.4   Calcium (mg/dL)  Date Value  02/89/0228 8.7 (L)   Albumin (g/dL)  Date Value  40/69/8614 3.4 (L)   Sodium (mmol/L)  Date Value  11/24/2019 145     Assessment: 73 y.o.malewith medical history significant forchronic systolic heart failure secondary to amyloidosis, history of CVA,ascending aortic aneurysm hypertension, chronic A. fib on Eliquis, with history of multiple hospitalizations for fluid overload/anasarca with EF 35 to 40% who presents to the emergency room with a complaint of increased swelling in the right arm andlegs.  Pt on Furosemide IV 60mg  q12 and Potassium po bid.  Goal of Therapy:  K > 4.0, Mg > 2.0  Plan:  Will give 1 more kcl po x 1 for a total of 120 meq in 24 hours.  Will recheck K, Mg with am labs.  ,PharmD Clinical Pharmacist 11/24/2019 2:18 PM

## 2019-11-24 NOTE — Progress Notes (Signed)
PROGRESS NOTE    Franklin Obrien  EXB:[id number] DOB: 09/19/1946 DOA: 11/23/2019 PCP: Patient, No Pcp Per   Brief Narrative:  HPI: Franklin Obrien is a 74 y.o. 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 fib on Eliquis, with history of multiple hospitalizations for fluid overload/anasarca at [facility], most recently February 2021 with EF 35 to 40% with elevated troponin around 200, treated as demand ischemia from heart failure exacerbation, who presents to the emergency room with a complaint of increased swelling in the right arm and legs. chartreview reveals that it has been a recurrent problem for patient and has been attributed to his fluid buildup.  He denies orthopnea and is lying flat comfortably.  Patient denies shortness of breath, denies chest pain, denies nausea vomiting, palpitations lightheadedness  2/28: Patient seen and examined.  States he has been producing large amount of urine.  Per documentation patient is net positive which is unlikely.  Remains on room air.  Still with some shortness of breath   Assessment & Plan:   Principal Problem:   Acute on chronic systolic CHF (congestive heart failure) (HCC) Active Problems:   Anasarca   Atrial fibrillation, chronic (HCC)   Non-ischemic cardiomyopathy (HCC)   History of CVA (cerebrovascular accident) without residual deficits   Essential hypertension   Hypokalemia   Elevated troponin   Aneurysm of ascending aorta (HCC)   Acute on chronic systolic (congestive) heart failure (HCC)    Acute on chronic systolic CHF (congestive heart failure) (HCC)   Non-ischemic cardiomyopathy (HCC) TRR amyloidosis   elevated troponin, suspect demand ischemia   Anasarca -Patient with a history of multiple hospitalizations with fluid overload, chart reviewed revealing problems with compliance with medication -Troponin elevation  234>>251 appears to be baseline compared to recent hospitalization.  In the absence of chest pain and acute EKG changes, suspect demand ischemia related to heart failure exacerbation -Last echocardiogram February 2021 at [facility] showed EF 35 to 40%. Plan: Continue IV Lasix, 60 mg twice daily Daily weights Strict I's and O's Low-sodium diet No beta-blocker secondary to amyloidosis Per documentation no ACE inhibitor secondary to amyloidosis    Hypokalemia -IV and oral repletion.  Continue to monitor Keep K greater than 4, mag greater than 2    Atrial fibrillation, chronic (HCC) -Not currently on beta-blockers secondary to amyloidosis -Continue Eliquis    History of CVA (cerebrovascular accident) without residual deficits Continue home aspirin 81 mg daily    Essential hypertension No antihypertensives on home med rec    Aneurysm of ascending aorta (HCC) -Review of records from [facility] showing last diameter was 4.5 in August 2020 with plan for 33-month reevaluation -No acute concerns at this time   DVT prophylaxis: Eliquis Code Status: Full Family Communication: None today Disposition Plan: Return back to previous home environment, anticipate patient will need 2 to 3 days of IV diuresis to approach euvolemia  Consultants:   None  Procedures:   None  Antimicrobials:   None   Subjective: Patient seen and examined No acute status changes overnight Reports subjective improvement in shortness of breath  Objective: Vitals:   11/24/19 0348 11/24/19 0748 11/24/19 1148 11/24/19 1149  BP: (!) 113/91 116/76 (!) 119/102 125/87  Pulse: 69 88 83 79  Resp: 20 18 18    Temp: 97.7 F (36.5 C) 98.4 F (36.9 C) 97.9 F (36.6 C)   TempSrc:   Oral   SpO2: 97% 97%  96% 98%  Weight: 110.6 kg     Height:        Intake/Output Summary (Last 24 hours) at 11/24/2019 1400 Last data filed at 11/24/2019 0915 Gross per 24 hour  Intake 680 ml  Output 500 ml  Net 180 ml   Filed Weights    11/23/19 1609 11/23/19 2309 11/24/19 0348  Weight: 108.9 kg 110.6 kg 110.6 kg    Examination:  Constitutional: NAD, alert and oriented x 3 Eyes: PERRL, lids and conjunctivae normal ENMT: Mucous membranes are moist.  Neck: normal, supple, no masses, no thyromegaly Respiratory: Few rales bilateral bases. Normal respiratory effort. No accessory muscle use.  Cardiovascular: Regular rate and rhythm, no murmurs / rubs / gallops.  3+ pitting edema right upper extremity.  1-2+ pitting edema lower extremities. 2+ pedal pulses. No carotid bruits.  Abdomen: no tenderness, no masses palpated. No hepatosplenomegaly. Bowel sounds positive.  Musculoskeletal: no clubbing / cyanosis. No joint deformity upper and lower extremities.  Skin: no rashes, lesions, ulcers.  Neurologic: No gross focal neurologic deficit. Psychiatric: Normal mood and affect.    Data Reviewed: I have personally reviewed following labs and imaging studies  CBC: Recent Labs  Lab 11/23/19 1615  WBC 5.6  HGB 11.4*  HCT 36.6*  MCV 84.3  PLT 205   Basic Metabolic Panel: Recent Labs  Lab 11/23/19 1615 11/24/19 0548  NA 144 145  K 2.8* 3.4*  CL 108 107  CO2 27 28  GLUCOSE 113* 87  BUN 20 19  CREATININE 1.17 1.19  CALCIUM 8.7* 8.7*  MG  --  2.4   GFR: Estimated Creatinine Clearance: 66.7 mL/min (by C-G formula based on SCr of 1.19 mg/dL). Liver Function Tests: Recent Labs  Lab 11/23/19 1740  AST 39  ALT 21  ALKPHOS 105  BILITOT 3.1*  PROT 7.2  ALBUMIN 3.4*   No results for input(s): LIPASE, AMYLASE in the last 168 hours. No results for input(s): AMMONIA in the last 168 hours. Coagulation Profile: Recent Labs  Lab 11/23/19 1740  INR 1.0   Cardiac Enzymes: No results for input(s): CKTOTAL, CKMB, CKMBINDEX, TROPONINI in the last 168 hours. BNP (last 3 results) No results for input(s): PROBNP in the last 8760 hours. HbA1C: No results for input(s): HGBA1C in the last 72 hours. CBG: No results for  input(s): GLUCAP in the last 168 hours. Lipid Profile: No results for input(s): CHOL, HDL, LDLCALC, TRIG, CHOLHDL, LDLDIRECT in the last 72 hours. Thyroid Function Tests: Recent Labs    11/23/19 1828  TSH 3.656   Anemia Panel: No results for input(s): VITAMINB12, FOLATE, FERRITIN, TIBC, IRON, RETICCTPCT in the last 72 hours. Sepsis Labs: Recent Labs  Lab 11/24/19 0800  PROCALCITON <0.10    Recent Results (from the past 240 hour(s))  SARS CORONAVIRUS 2 (TAT 6-24 HRS) Nasopharyngeal Nasopharyngeal Swab     Status: None   Collection Time: 11/23/19  9:41 PM   Specimen: Nasopharyngeal Swab  Result Value Ref Range Status   SARS Coronavirus 2 NEGATIVE NEGATIVE Final    Comment: (NOTE) SARS-CoV-2 target nucleic acids are NOT DETECTED. The SARS-CoV-2 RNA is generally detectable in upper and lower respiratory specimens during the acute phase of infection. Negative results do not preclude SARS-CoV-2 infection, do not rule out co-infections with other pathogens, and should not be used as the sole basis for treatment or other patient management decisions. Negative results must be combined with clinical observations, patient history, and epidemiological information. The expected result is Negative. Fact Sheet  for Patients: HairSlick.no Fact Sheet for Healthcare Providers: quierodirigir.com This test is not yet approved or cleared by the [place] FDA and  has been authorized for detection and/or diagnosis of SARS-CoV-2 by FDA under an Emergency Use Authorization (EUA). This EUA will remain  in effect (meaning this test can be used) for the duration of the COVID-19 declaration under Section 56 4(b)(1) of the Act, 21 U.S.C. section 360bbb-3(b)(1), unless the authorization is terminated or revoked sooner. Performed at [facility], [address], [place], [place] [address]          Radiology Studies: DG Chest 2  View  Result Date: 11/23/2019 CLINICAL DATA:  74 year old male with shortness of breath. EXAM: CHEST - 2 VIEW COMPARISON:  11/06/2019 chest radiograph FINDINGS: Cardiomegaly and pulmonary vascular congestion noted. There is no evidence of focal airspace disease, pulmonary edema, suspicious pulmonary nodule/mass, pleural effusion, or pneumothorax. No acute bony abnormalities are identified. IMPRESSION: Cardiomegaly with pulmonary vascular congestion. Electronically Signed   By: Harmon Pier M.D.   On: 11/23/2019 17:23   CT Head Wo Contrast  Result Date: 11/23/2019 CLINICAL DATA:  Hearing voices, question of TIA EXAM: CT HEAD WITHOUT CONTRAST TECHNIQUE: Contiguous axial images were obtained from the base of the skull through the vertex without intravenous contrast. COMPARISON:  None. FINDINGS: Brain: No evidence of acute territorial infarction, hemorrhage, hydrocephalus,extra-axial collection or mass lesion/mass effect. There is dilatation the ventricles and sulci consistent with age-related atrophy. Low-attenuation changes in the deep white matter consistent with small vessel ischemia. Vascular: No hyperdense vessel or unexpected calcification. Skull: The skull is intact. No fracture or focal lesion identified. Sinuses/Orbits: The visualized paranasal sinuses and mastoid air cells are clear. The orbits and globes intact. Other: Question of mild soft tissue swelling seen over the left frontal lobe. IMPRESSION: No acute intracranial abnormality. Findings consistent with age related atrophy and chronic small vessel ischemia Electronically Signed   By: Jonna Clark M.D.   On: 11/23/2019 19:34   CT Angio Aortobifemoral W and/or Wo Contrast  Result Date: 11/23/2019 CLINICAL DATA:  Right leg pain, short of breath, anasarca EXAM: CT ANGIOGRAPHY OF ABDOMINAL AORTA WITH ILIOFEMORAL RUNOFF TECHNIQUE: Multidetector CT imaging of the abdomen, pelvis and lower extremities was performed using the standard protocol during  bolus administration of intravenous contrast. Multiplanar CT image reconstructions and MIPs were obtained to evaluate the vascular anatomy. CONTRAST:  OMNIPAQUE IOHEXOL 350 MG/ML SOLN COMPARISON:  None. FINDINGS: VASCULAR Aorta: Normal caliber aorta without aneurysm, dissection, vasculitis or significant stenosis. Celiac: Patent without evidence of aneurysm, dissection, vasculitis or significant stenosis. SMA: Patent without evidence of aneurysm, dissection, vasculitis or significant stenosis. Renals: Both renal arteries are patent without evidence of aneurysm, dissection, vasculitis, fibromuscular dysplasia or significant stenosis. IMA: Patent without evidence of aneurysm, dissection, vasculitis or significant stenosis. RIGHT Lower Extremity Inflow: Common, internal and external iliac arteries are patent without evidence of aneurysm, dissection, vasculitis or significant stenosis. Mild atheromatous plaque at the iliac bifurcation. Outflow: Common, superficial and profunda femoral arteries and the popliteal artery are patent without evidence of aneurysm, dissection, vasculitis or significant stenosis. Runoff: The vessels distal to the trifurcation are not adequately evaluated due to incomplete opacification, likely due to timing of the contrast bolus. LEFT Lower Extremity Inflow: Common, internal and external iliac arteries are patent without evidence of aneurysm, dissection, vasculitis or significant stenosis. Outflow: Common, superficial and profunda femoral arteries and the popliteal artery are patent without evidence of aneurysm, dissection, vasculitis or significant stenosis.  Runoff: The proximal aspect of the trifurcation vessels are widely patent. There is mild calcified atheromatous plaque within the tibioperoneal trunk. Just proximal to the ankle, evaluation of the distal runoff vessels is nondiagnostic due to incomplete opacification likely due to timing of contrast bolus. Veins: No obvious venous  abnormality within the limitations of this arterial phase study. Review of the MIP images confirms the above findings. NON-VASCULAR Lower chest: There are small bilateral pleural effusions. Otherwise the lung bases are clear. The heart is enlarged. No pericardial effusion. Severe right atrial dilatation. Hepatobiliary: Reflux of contrast into the hepatic veins consistent with cardiac dysfunction. Otherwise the liver is unremarkable. No evidence of cholelithiasis or cholecystitis. Pancreas: Unremarkable. No pancreatic ductal dilatation or surrounding inflammatory changes. Spleen: Normal in size without focal abnormality. Adrenals/Urinary Tract: Adrenal glands are unremarkable. Kidneys are normal, without renal calculi, focal lesion, or hydronephrosis. Bladder is unremarkable. Stomach/Bowel: No bowel obstruction or ileus. Normal appendix right lower quadrant. No wall thickening or inflammatory changes. Lymphatic: No pathologic adenopathy within the abdomen or pelvis. Reproductive: Prostate is mildly enlarged measuring approximately 5.0 x 5.2 cm. Other: There is diffuse anasarca. No significant ascites. There is a small umbilical hernia containing a portion of small bowel. No evidence of strangulation or incarceration. Musculoskeletal: No acute or destructive bony lesions. There is mild diffuse osteoarthritis throughout the bilateral lower extremities, most pronounced within the medial compartment of the bilateral knees. Reconstructed images demonstrate no additional findings. IMPRESSION: VASCULAR 1. Normal caliber abdominal aorta without aneurysm or dissection. 2. Widely patent proximal aspect of the trifurcation vessels bilaterally. The distal runoff vessels are not adequately evaluated due to incomplete opacification, likely due to timing of the contrast bolus. NON-VASCULAR 1. Diffuse anasarca with small bilateral pleural effusions. 2. Cardiomegaly with severe right atrial dilatation and reflux of contrast into the  hepatic veins suggestive of cardiac dysfunction. 3. Mildly enlarged prostate. 4. Small umbilical hernia containing a portion of small bowel. No evidence of strangulation or incarceration. 5. Mild diffuse osteoarthritis throughout the bilateral lower extremities, most pronounced within the medial compartment of the bilateral knees. Electronically Signed   By: Sharlet Salina M.D.   On: 11/23/2019 20:05   US Venous Img Upper Uni Right(DVT)  Result Date: 11/23/2019 CLINICAL DATA:  Right upper extremity swelling EXAM: RIGHT UPPER EXTREMITY VENOUS DOPPLER ULTRASOUND TECHNIQUE: Gray-scale sonography with graded compression, as well as color Doppler and duplex ultrasound were performed to evaluate the upper extremity deep venous system from the level of the subclavian vein and including the jugular, axillary, basilic, radial, ulnar and upper cephalic vein. Spectral Doppler was utilized to evaluate flow at rest and with distal augmentation maneuvers. COMPARISON:  None. FINDINGS: Contralateral Subclavian Vein: Respiratory phasicity is normal and symmetric with the symptomatic side. No evidence of thrombus. Normal compressibility. Internal Jugular Vein: No evidence of thrombus. Normal compressibility, respiratory phasicity and response to augmentation. Subclavian Vein: No evidence of thrombus. Normal compressibility, respiratory phasicity and response to augmentation. Axillary Vein: No evidence of thrombus. Normal compressibility, respiratory phasicity and response to augmentation. Cephalic Vein: No evidence of thrombus. Normal compressibility, respiratory phasicity and response to augmentation. Basilic Vein: No evidence of thrombus. Normal compressibility, respiratory phasicity and response to augmentation. Brachial Veins: No evidence of thrombus. Normal compressibility, respiratory phasicity and response to augmentation. Radial Veins: No evidence of thrombus. Normal compressibility, respiratory phasicity and response to  augmentation. Ulnar Veins: No evidence of thrombus. Normal compressibility, respiratory phasicity and response to augmentation. Venous Reflux:  None visualized. Other Findings: Nonspecific  right upper extremity swelling is noted. IMPRESSION: No evidence of DVT within the right upper extremity. Nonspecific right upper extremity edema is noted. Electronically Signed   By: Constance Holster M.D.   On: 11/23/2019 18:44        Scheduled Meds:  apixaban  5 mg Oral BID   aspirin  81 mg Oral Daily   furosemide  60 mg Intravenous Q12H   potassium chloride  40 mEq Oral BID   sodium chloride flush  3 mL Intravenous Q12H   Continuous Infusions:  sodium chloride Stopped (11/24/19 0800)   sodium chloride       LOS: 1 day    Time spent: 35 minutes    Sidney Ace, MD [facility] Pager [phone]  If 7PM-7AM, please contact night-coverage  11/24/2019, 2:00 PM

## 2019-11-24 NOTE — Progress Notes (Signed)
Patient refuses HS medications after several attempts from this nurse will attempt again later. Patient states he is " allergic to these medications and that someone keeps forgetting to update his chart"

## 2019-11-25 LAB — BASIC METABOLIC PANEL
Anion gap: 10 (ref 5–15)
BUN: 22 mg/dL (ref 8–23)
CO2: 27 mmol/L (ref 22–32)
Calcium: 8.7 mg/dL — ABNORMAL LOW (ref 8.9–10.3)
Chloride: 106 mmol/L (ref 98–111)
Creatinine, Ser: 1.1 mg/dL (ref 0.61–1.24)
GFR calc Af Amer: >60 mL/min (ref >60)
GFR calc non Af Amer: >60 mL/min (ref >60)
Glucose, Bld: 83 mg/dL (ref 70–99)
Potassium: 3.9 mmol/L (ref 3.5–5.1)
Sodium: 143 mmol/L (ref 135–145)

## 2019-11-25 LAB — MAGNESIUM: Magnesium: 2.5 mg/dL — ABNORMAL HIGH (ref 1.7–2.4)

## 2019-11-25 MED ORDER — IPRATROPIUM-ALBUTEROL 0.5-2.5 (3) MG/3ML IN SOLN: 3.0000 mL | RESPIRATORY_TRACT | Status: DC | PRN

## 2019-11-25 NOTE — Progress Notes (Signed)
Alerted Dr. Georgeann Oppenheim that it is difficult to measure this patient's I/Os. He is very suspicious and impulsive. Patient will urinate on the floor or in a plastic bag. Patient refuses a male external catheter. Patient is also refusing medications, potassium and eliquis, after extensive education on both medications. Patient needs to be wearing oxygen but continues to take it off after frequent reeducation on needing to keep it on. Patient will not elevate legs. Standing weight obtained, in chart.Also alerted MD that patient frequently takes of EKG leads.  Md aware of patient's situation.

## 2019-11-25 NOTE — Progress Notes (Addendum)
MD requested Q6 bladder scans. Patient showed on bladder scan at 0800. Patient refused bladder scan at 1500 but had just urinated.   Patient is also refusing oxygen and incentive spirometer.

## 2019-11-25 NOTE — Plan of Care (Signed)
  Problem: Education: Goal: Knowledge of General Education information will improve Description: Including pain rating scale, medication(s)/side effects and non-pharmacologic comfort measures Outcome: Not Progressing   

## 2019-11-25 NOTE — Consult Note (Signed)
PHARMACY CONSULT NOTE - FOLLOW UP  Pharmacy Consult for Electrolyte Monitoring and Replacement   Recent Labs: Potassium (mmol/L)  Date Value  11/25/2019 3.9   Magnesium (mg/dL)  Date Value  35/00/9381 2.5 (H)   Calcium (mg/dL)  Date Value  82/99/3716 8.7 (L)   Albumin (g/dL)  Date Value  96/78/9381 3.4 (L)   Sodium (mmol/L)  Date Value  11/25/2019 143     Assessment: 73 y.o.malewith medical history significant forchronic systolic heart failure secondary to amyloidosis, history of CVA,ascending aortic aneurysm hypertension, chronic A. fib on Eliquis, with history of multiple hospitalizations for fluid overload/anasarca with EF 35 to 40% who presents to the emergency room with a complaint of increased swelling in the right arm andlegs.  Pt on Furosemide IV 60mg  q12 and Potassium po bid.  Goal of Therapy:  K > 4.0, Mg > 2.0  Plan:  No replacement needed today.  Will recheck BMP with AM labs.   ,PharmD, BCPS Clinical Pharmacist 11/25/2019 8:43 AM

## 2019-11-25 NOTE — Progress Notes (Addendum)
Patient insists on leaving AMA. Education provided and patient continues to insist. Dr. Georgeann Oppenheim informed who stated that the patient is alert and oriented and can sign AMA paperwork an leave. IV removed. Tele monitor removed and returned to nurses station. Patient signed AMA paperwork and was wheeled out. Patient is insistent that he wants to leave AMA and that he can get home. Educated patient.

## 2019-11-25 NOTE — Progress Notes (Signed)
PROGRESS NOTE    Franklin Obrien  ZJQ:[id number] DOB: 21-Jan-1946 DOA: 11/23/2019 PCP: Patient, No Pcp Per   Brief Narrative:  HPI: Franklin Obrien is a 74 y.o. 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 fib on Eliquis, with history of multiple hospitalizations for fluid overload/anasarca at [facility], most recently February 2021 with EF 35 to 40% with elevated troponin around 200, treated as demand ischemia from heart failure exacerbation, who presents to the emergency room with a complaint of increased swelling in the right arm and legs. chartreview reveals that it has been a recurrent problem for patient and has been attributed to his fluid buildup.  He denies orthopnea and is lying flat comfortably.  Patient denies shortness of breath, denies chest pain, denies nausea vomiting, palpitations lightheadedness  2/28: Patient seen and examined.  States he has been producing large amount of urine.  Per documentation patient is net positive which is unlikely.  Remains on room air.  Still with some shortness of breath  3/1: Patient seen and examined.  Discussed with bedside RN.  Patient is exhibiting some strange behavior which has made it difficult to accurately record urine output.  Patient now requiring 2 to 4 L supplemental oxygen.  Appears short of breath.   Assessment & Plan:   Principal Problem:   Acute on chronic systolic CHF (congestive heart failure) (HCC) Active Problems:   Anasarca   Atrial fibrillation, chronic (HCC)   Non-ischemic cardiomyopathy (HCC)   History of CVA (cerebrovascular accident) without residual deficits   Essential hypertension   Hypokalemia   Elevated troponin   Aneurysm of ascending aorta (HCC)   Acute on chronic systolic (congestive) heart failure (HCC)    Acute on chronic systolic CHF (congestive heart failure) (HCC)   Non-ischemic  cardiomyopathy (HCC) TRR amyloidosis   elevated troponin, suspect demand ischemia   Anasarca Acute hypoxic respiratory failure secondary to above -Patient with a history of multiple hospitalizations with fluid overload, chart reviewed revealing problems with compliance with medication -Troponin elevation 234>>251 appears to be baseline compared to recent hospitalization.  In the absence of chest pain and acute EKG changes, suspect demand ischemia related to heart failure exacerbation -Last echocardiogram February 2021 at [facility] showed EF 35 to 40%. Plan: Continue IV Lasix, 60 mg twice daily Daily weights Strict I's and O's Low-sodium diet No beta-blocker secondary to amyloidosis Per documentation no ACE inhibitor secondary to amyloidosis As urine output recording has become difficult recommend taking daily standing weights to assess response to diuresis Titrate off oxygen as tolerated Incentive spirometry DuoNebs every 4 hours    Hypokalemia -IV and oral repletion.  Continue to monitor Keep K greater than 4, mag greater than 2    Atrial fibrillation, chronic (HCC) -Not currently on beta-blockers secondary to amyloidosis Rate appears controlled -Continue Eliquis    History of CVA (cerebrovascular accident) without residual deficits Continue home aspirin 81 mg daily    Essential hypertension No antihypertensives on home med rec    Aneurysm of ascending aorta (HCC) -Review of records from [facility] showing last diameter was 4.5 in August 2020 with plan for 17-month reevaluation -No acute concerns at this time   DVT prophylaxis: Eliquis Code Status: Full Family Communication: None today Disposition Plan: Return back to previous home environment, anticipate patient will need 2 to 3 days of IV diuresis to approach euvolemia  Consultants:   None  Procedures:   None  Antimicrobials:   None   Subjective: Patient seen and examined No acute status changes overnight Still  feels very short of breath  Objective: Vitals:   11/25/19 0734 11/25/19 0745 11/25/19 0801 11/25/19 0801  BP: 116/87   123/84  Pulse: 87   90  Resp: 19 18    Temp: 97.6 F (36.4 C) 97.7 F (36.5 C)    TempSrc: Oral Oral    SpO2: 92% 94% 94% 100%  Weight:    112.2 kg  Height:        Intake/Output Summary (Last 24 hours) at 11/25/2019 1158 Last data filed at 11/25/2019 0801 Gross per 24 hour  Intake 600 ml  Output 350 ml  Net 250 ml   Filed Weights   11/24/19 0348 11/25/19 0151 11/25/19 0801  Weight: 110.6 kg 113.2 kg 112.2 kg    Examination:  Constitutional: NAD, alert and oriented x 3 Eyes: PERRL, lids and conjunctivae normal ENMT: Mucous membranes are moist.  Neck: normal, supple, no masses, no thyromegaly Respiratory: Few rales bilateral bases. Normal respiratory effort. No accessory muscle use.  Cardiovascular: Regular rate and rhythm, no murmurs / rubs / gallops.  3+ pitting edema right upper extremity.  1-2+ pitting edema lower extremities. 2+ pedal pulses. No carotid bruits.  Abdomen: no tenderness, no masses palpated. No hepatosplenomegaly. Bowel sounds positive.  Musculoskeletal: no clubbing / cyanosis. No joint deformity upper and lower extremities.  Skin: no rashes, lesions, ulcers.  Neurologic: No gross focal neurologic deficit. Psychiatric: Normal mood and affect.    Data Reviewed: I have personally reviewed following labs and imaging studies  CBC: Recent Labs  Lab 11/23/19 1615  WBC 5.6  HGB 11.4*  HCT 36.6*  MCV 84.3  PLT 205   Basic Metabolic Panel: Recent Labs  Lab 11/23/19 1615 11/24/19 0548 11/25/19 0453  NA 144 145 143  K 2.8* 3.4* 3.9  CL 108 107 106  CO2 27 28 27   GLUCOSE 113* 87 83  BUN 20 19 22   CREATININE 1.17 1.19 1.10  CALCIUM 8.7* 8.7* 8.7*  MG  --  2.4 2.5*   GFR: Estimated Creatinine Clearance: 72.7 mL/min (by C-G formula based on SCr of 1.1 mg/dL). Liver Function Tests: Recent Labs  Lab 11/23/19 1740  AST 39    ALT 21  ALKPHOS 105  BILITOT 3.1*  PROT 7.2  ALBUMIN 3.4*   No results for input(s): LIPASE, AMYLASE in the last 168 hours. No results for input(s): AMMONIA in the last 168 hours. Coagulation Profile: Recent Labs  Lab 11/23/19 1740  INR 1.0   Cardiac Enzymes: No results for input(s): CKTOTAL, CKMB, CKMBINDEX, TROPONINI in the last 168 hours. BNP (last 3 results) No results for input(s): PROBNP in the last 8760 hours. HbA1C: No results for input(s): HGBA1C in the last 72 hours. CBG: No results for input(s): GLUCAP in the last 168 hours. Lipid Profile: No results for input(s): CHOL, HDL, LDLCALC, TRIG, CHOLHDL, LDLDIRECT in the last 72 hours. Thyroid Function Tests: Recent Labs    11/23/19 1828  TSH 3.656   Anemia Panel: No results for input(s): VITAMINB12, FOLATE, FERRITIN, TIBC, IRON, RETICCTPCT in the last 72 hours. Sepsis Labs: Recent Labs  Lab 11/24/19 0800  PROCALCITON <0.10    Recent Results (from the past 240 hour(s))  SARS CORONAVIRUS 2 (TAT 6-24 HRS) Nasopharyngeal Nasopharyngeal Swab     Status: None   Collection Time: 11/23/19  9:41 PM   Specimen: Nasopharyngeal Swab  Result Value Ref Range Status  SARS Coronavirus 2 NEGATIVE NEGATIVE Final    Comment: (NOTE) SARS-CoV-2 target nucleic acids are NOT DETECTED. The SARS-CoV-2 RNA is generally detectable in upper and lower respiratory specimens during the acute phase of infection. Negative results do not preclude SARS-CoV-2 infection, do not rule out co-infections with other pathogens, and should not be used as the sole basis for treatment or other patient management decisions. Negative results must be combined with clinical observations, patient history, and epidemiological information. The expected result is Negative. Fact Sheet for Patients: HairSlick.no Fact Sheet for Healthcare Providers: quierodirigir.com This test is not yet approved or  cleared by the [place] FDA and  has been authorized for detection and/or diagnosis of SARS-CoV-2 by FDA under an Emergency Use Authorization (EUA). This EUA will remain  in effect (meaning this test can be used) for the duration of the COVID-19 declaration under Section 56 4(b)(1) of the Act, 21 U.S.C. section 360bbb-3(b)(1), unless the authorization is terminated or revoked sooner. Performed at [facility], [address], [place], [place] [address]          Radiology Studies: DG Chest 2 View  Result Date: 11/23/2019 CLINICAL DATA:  74 year old male with shortness of breath. EXAM: CHEST - 2 VIEW COMPARISON:  11/06/2019 chest radiograph FINDINGS: Cardiomegaly and pulmonary vascular congestion noted. There is no evidence of focal airspace disease, pulmonary edema, suspicious pulmonary nodule/mass, pleural effusion, or pneumothorax. No acute bony abnormalities are identified. IMPRESSION: Cardiomegaly with pulmonary vascular congestion. Electronically Signed   By: Harmon Pier M.D.   On: 11/23/2019 17:23   CT Head Wo Contrast  Result Date: 11/23/2019 CLINICAL DATA:  Hearing voices, question of TIA EXAM: CT HEAD WITHOUT CONTRAST TECHNIQUE: Contiguous axial images were obtained from the base of the skull through the vertex without intravenous contrast. COMPARISON:  None. FINDINGS: Brain: No evidence of acute territorial infarction, hemorrhage, hydrocephalus,extra-axial collection or mass lesion/mass effect. There is dilatation the ventricles and sulci consistent with age-related atrophy. Low-attenuation changes in the deep white matter consistent with small vessel ischemia. Vascular: No hyperdense vessel or unexpected calcification. Skull: The skull is intact. No fracture or focal lesion identified. Sinuses/Orbits: The visualized paranasal sinuses and mastoid air cells are clear. The orbits and globes intact. Other: Question of mild soft tissue swelling seen over the left frontal lobe.  IMPRESSION: No acute intracranial abnormality. Findings consistent with age related atrophy and chronic small vessel ischemia Electronically Signed   By: Jonna Clark M.D.   On: 11/23/2019 19:34   CT Angio Aortobifemoral W and/or Wo Contrast  Result Date: 11/23/2019 CLINICAL DATA:  Right leg pain, short of breath, anasarca EXAM: CT ANGIOGRAPHY OF ABDOMINAL AORTA WITH ILIOFEMORAL RUNOFF TECHNIQUE: Multidetector CT imaging of the abdomen, pelvis and lower extremities was performed using the standard protocol during bolus administration of intravenous contrast. Multiplanar CT image reconstructions and MIPs were obtained to evaluate the vascular anatomy. CONTRAST:  OMNIPAQUE IOHEXOL 350 MG/ML SOLN COMPARISON:  None. FINDINGS: VASCULAR Aorta: Normal caliber aorta without aneurysm, dissection, vasculitis or significant stenosis. Celiac: Patent without evidence of aneurysm, dissection, vasculitis or significant stenosis. SMA: Patent without evidence of aneurysm, dissection, vasculitis or significant stenosis. Renals: Both renal arteries are patent without evidence of aneurysm, dissection, vasculitis, fibromuscular dysplasia or significant stenosis. IMA: Patent without evidence of aneurysm, dissection, vasculitis or significant stenosis. RIGHT Lower Extremity Inflow: Common, internal and external iliac arteries are patent without evidence of aneurysm, dissection, vasculitis or significant stenosis. Mild atheromatous plaque at the iliac bifurcation. Outflow:  Common, superficial and profunda femoral arteries and the popliteal artery are patent without evidence of aneurysm, dissection, vasculitis or significant stenosis. Runoff: The vessels distal to the trifurcation are not adequately evaluated due to incomplete opacification, likely due to timing of the contrast bolus. LEFT Lower Extremity Inflow: Common, internal and external iliac arteries are patent without evidence of aneurysm, dissection, vasculitis or  significant stenosis. Outflow: Common, superficial and profunda femoral arteries and the popliteal artery are patent without evidence of aneurysm, dissection, vasculitis or significant stenosis. Runoff: The proximal aspect of the trifurcation vessels are widely patent. There is mild calcified atheromatous plaque within the tibioperoneal trunk. Just proximal to the ankle, evaluation of the distal runoff vessels is nondiagnostic due to incomplete opacification likely due to timing of contrast bolus. Veins: No obvious venous abnormality within the limitations of this arterial phase study. Review of the MIP images confirms the above findings. NON-VASCULAR Lower chest: There are small bilateral pleural effusions. Otherwise the lung bases are clear. The heart is enlarged. No pericardial effusion. Severe right atrial dilatation. Hepatobiliary: Reflux of contrast into the hepatic veins consistent with cardiac dysfunction. Otherwise the liver is unremarkable. No evidence of cholelithiasis or cholecystitis. Pancreas: Unremarkable. No pancreatic ductal dilatation or surrounding inflammatory changes. Spleen: Normal in size without focal abnormality. Adrenals/Urinary Tract: Adrenal glands are unremarkable. Kidneys are normal, without renal calculi, focal lesion, or hydronephrosis. Bladder is unremarkable. Stomach/Bowel: No bowel obstruction or ileus. Normal appendix right lower quadrant. No wall thickening or inflammatory changes. Lymphatic: No pathologic adenopathy within the abdomen or pelvis. Reproductive: Prostate is mildly enlarged measuring approximately 5.0 x 5.2 cm. Other: There is diffuse anasarca. No significant ascites. There is a small umbilical hernia containing a portion of small bowel. No evidence of strangulation or incarceration. Musculoskeletal: No acute or destructive bony lesions. There is mild diffuse osteoarthritis throughout the bilateral lower extremities, most pronounced within the medial compartment of  the bilateral knees. Reconstructed images demonstrate no additional findings. IMPRESSION: VASCULAR 1. Normal caliber abdominal aorta without aneurysm or dissection. 2. Widely patent proximal aspect of the trifurcation vessels bilaterally. The distal runoff vessels are not adequately evaluated due to incomplete opacification, likely due to timing of the contrast bolus. NON-VASCULAR 1. Diffuse anasarca with small bilateral pleural effusions. 2. Cardiomegaly with severe right atrial dilatation and reflux of contrast into the hepatic veins suggestive of cardiac dysfunction. 3. Mildly enlarged prostate. 4. Small umbilical hernia containing a portion of small bowel. No evidence of strangulation or incarceration. 5. Mild diffuse osteoarthritis throughout the bilateral lower extremities, most pronounced within the medial compartment of the bilateral knees. Electronically Signed   By: Randa Ngo M.D.   On: 11/23/2019 20:05   US Venous Img Upper Uni Right(DVT)  Result Date: 11/23/2019 CLINICAL DATA:  Right upper extremity swelling EXAM: RIGHT UPPER EXTREMITY VENOUS DOPPLER ULTRASOUND TECHNIQUE: Gray-scale sonography with graded compression, as well as color Doppler and duplex ultrasound were performed to evaluate the upper extremity deep venous system from the level of the subclavian vein and including the jugular, axillary, basilic, radial, ulnar and upper cephalic vein. Spectral Doppler was utilized to evaluate flow at rest and with distal augmentation maneuvers. COMPARISON:  None. FINDINGS: Contralateral Subclavian Vein: Respiratory phasicity is normal and symmetric with the symptomatic side. No evidence of thrombus. Normal compressibility. Internal Jugular Vein: No evidence of thrombus. Normal compressibility, respiratory phasicity and response to augmentation. Subclavian Vein: No evidence of thrombus. Normal compressibility, respiratory phasicity and response to augmentation. Axillary Vein: No evidence of  thrombus. Normal compressibility, respiratory phasicity and response to augmentation. Cephalic Vein: No evidence of thrombus. Normal compressibility, respiratory phasicity and response to augmentation. Basilic Vein: No evidence of thrombus. Normal compressibility, respiratory phasicity and response to augmentation. Brachial Veins: No evidence of thrombus. Normal compressibility, respiratory phasicity and response to augmentation. Radial Veins: No evidence of thrombus. Normal compressibility, respiratory phasicity and response to augmentation. Ulnar Veins: No evidence of thrombus. Normal compressibility, respiratory phasicity and response to augmentation. Venous Reflux:  None visualized. Other Findings: Nonspecific right upper extremity swelling is noted. IMPRESSION: No evidence of DVT within the right upper extremity. Nonspecific right upper extremity edema is noted. Electronically Signed   By: Katherine Mantle M.D.   On: 11/23/2019 18:44        Scheduled Meds: . apixaban  5 mg Oral BID  . aspirin  81 mg Oral Daily  . furosemide  60 mg Intravenous Q12H  . potassium chloride  40 mEq Oral BID  . sodium chloride flush  3 mL Intravenous Q12H   Continuous Infusions: . sodium chloride Stopped (11/24/19 0800)  . sodium chloride       LOS: 2 days    Time spent: 35 minutes    Tresa Moore, MD [facility] Pager [phone]  If 7PM-7AM, please contact night-coverage  11/25/2019, 11:58 AM

## 2019-11-26 NOTE — Discharge Summary (Signed)
AMA discharge summary  Patient was being treated for acute on chronic respiratory failure and systolic heart failure in the setting of decompensated systolic heart failure.  The patient was alert and oriented and had been accepting medications however on the day of AMA discharge he was refusing medications intermittently and stating that he had allergies to his home medications that were documented prior to admission.  Had a lengthy conversation with the patient.  I explained that the only new medication that we were starting for him was intravenous Lasix and the remainder of his medications were all established prior to arrival medications he expressed understanding and agreed to adhere to our prescribed medication regimen at the time my evaluation in the morning.  However later the afternoon I received a call from bedside nurse stating that the patient was insistent upon leaving AGAINST MEDICAL ADVICE.  Bedside nurse explained that this was not recommended and that the medications we are administering were only meant to treat his underlying heart failure.  Despite this patient decided to leave AGAINST MEDICAL ADVICE.  On chart review the patient has a history of refusing medications, poor medication adherence and leaving AGAINST MEDICAL ADVICE on previous admissions.  Given the patient's heart failure he is a high risk for clinical decompensation and readmission.  Lolita Patella MD

## 2019-11-27 ENCOUNTER — Telehealth: Payer: Self-pay | Admitting: Family

## 2019-11-27 NOTE — Telephone Encounter (Signed)
Called and was unable to reach patient regarding his New Patient appt we have scheduled for the CHF Clinic after his recent hospital d/c and to follow up with his status since discharge.    Deetta Perla, [place]

## 2019-12-03 ENCOUNTER — Ambulatory Visit: Payer: Medicare Other | Admitting: Family

## 2019-12-03 ENCOUNTER — Telehealth: Payer: Self-pay | Admitting: Family

## 2019-12-03 NOTE — Telephone Encounter (Signed)
Patient did not show for his Heart Failure Clinic appointment on 12/03/19. Will attempt to reschedule.

## 2019-12-03 NOTE — Progress Notes (Deleted)
   Patient ID: Franklin Obrien, male    DOB: 10-26-1945, 74 y.o.   MRN: [id number]  HPI  Franklin Obrien is a 74 y/o male with a history of  Echo report from 09/10/18 reviewed and showed an EF of 40-45% along with mildly elevated PA pressure.   Admitted 11/23/19 due to           Discharged after 2 days.   Review of Systems    Physical Exam    Assessment & Plan:  1: Chronic heart failure with mildly reduced ejection fraction- - NYHA class  2:

## 2020-03-26 DEATH — deceased

## 2021-05-03 IMAGING — CT CT HEAD W/O CM
3 series · 16 of 47 positions shown, 19 images · non-contrast
Comparison: None.

CLINICAL DATA: Hearing voices, question of TIA

EXAM:
CT HEAD WITHOUT CONTRAST
TECHNIQUE: Contiguous axial images were obtained from the base of the skull
through the vertex without intravenous contrast.

[Series 3: head wo · axial · 0.41mm/px · z∈[-176,-51]mm · 10 of 31 slices shown, 13 images]
[im 3/31  brain]
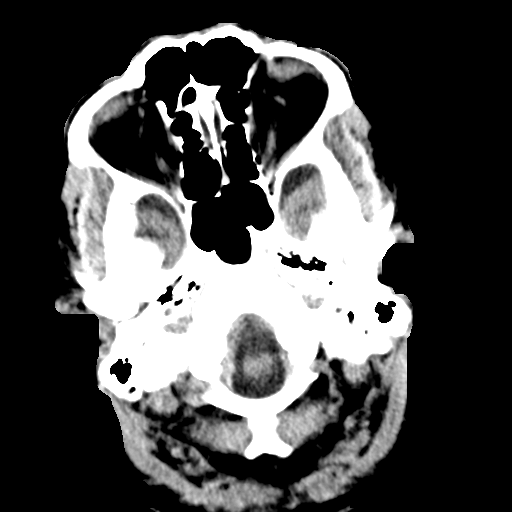
[im 3/31  bone]
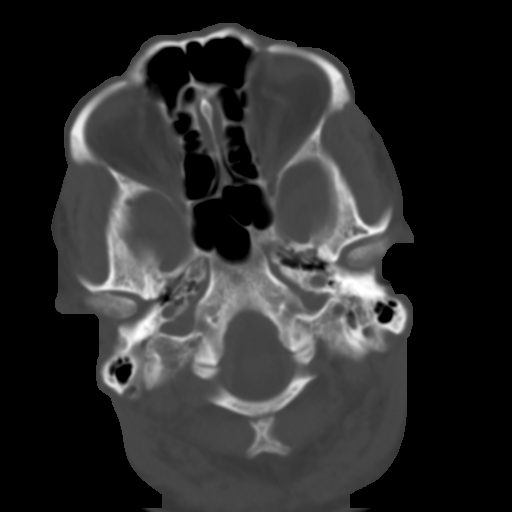
[im 6/31  brain]
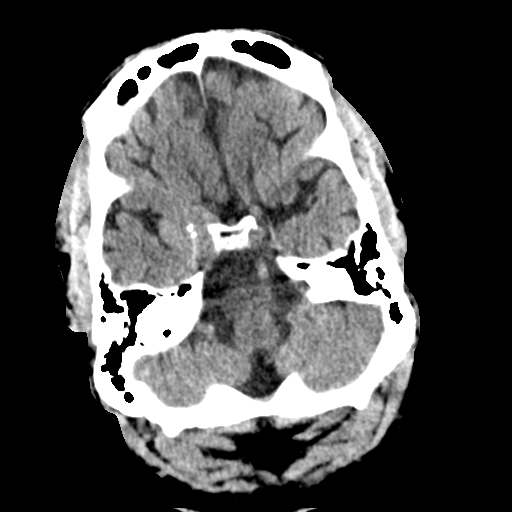
[im 9/31  brain]
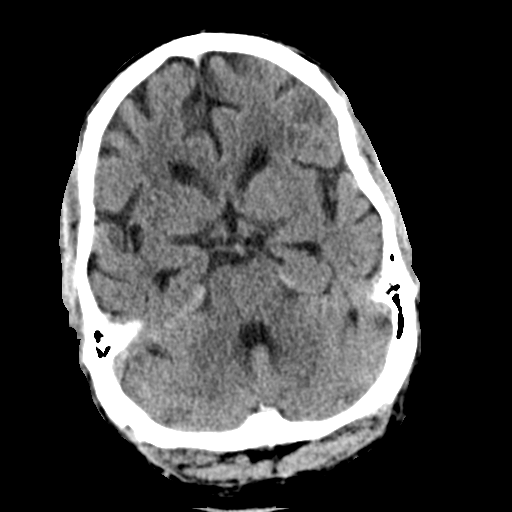
[im 11/31  brain]
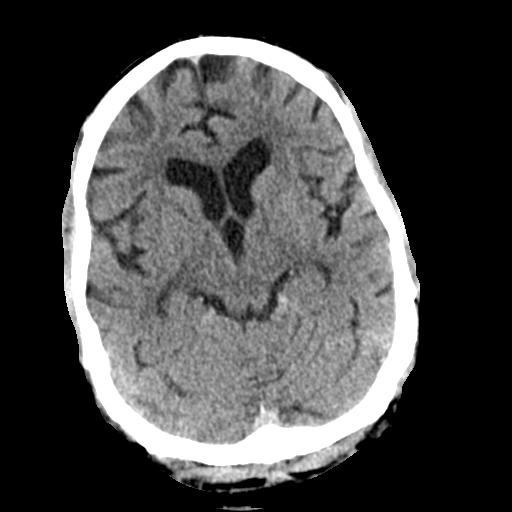
[im 14/31  brain]
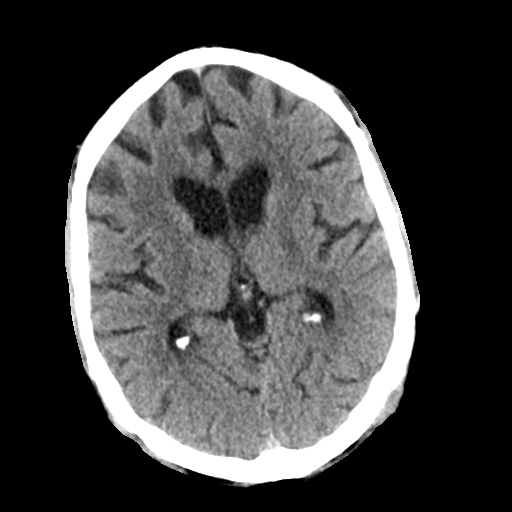
[im 14/31  bone]
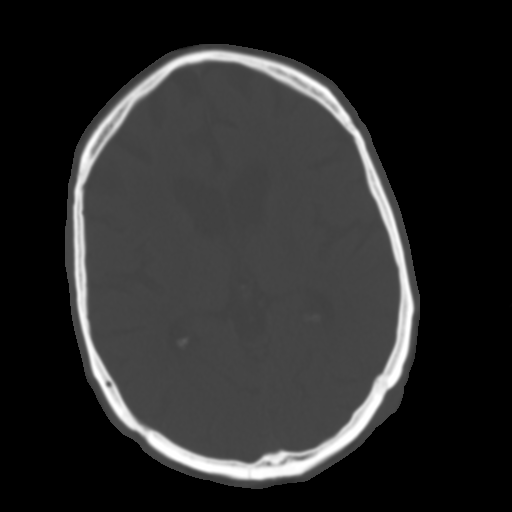
[im 17/31  brain]
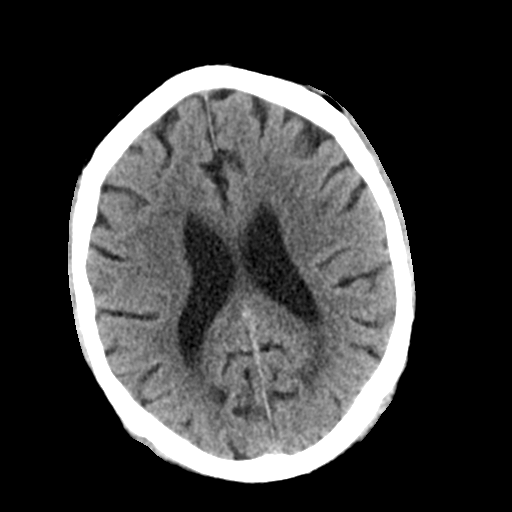
[im 20/31  brain]
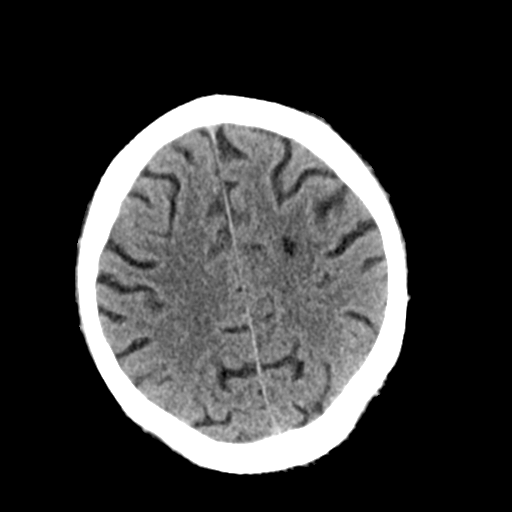
[im 23/31  brain]
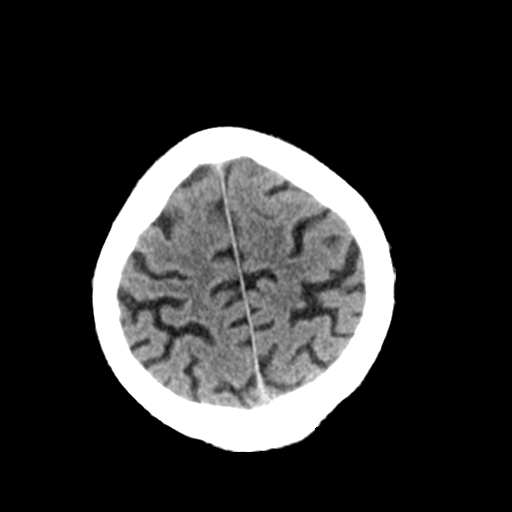
[im 25/31  brain]
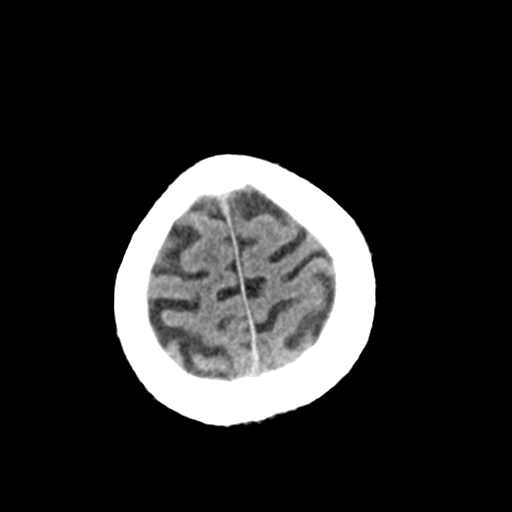
[im 25/31  bone]
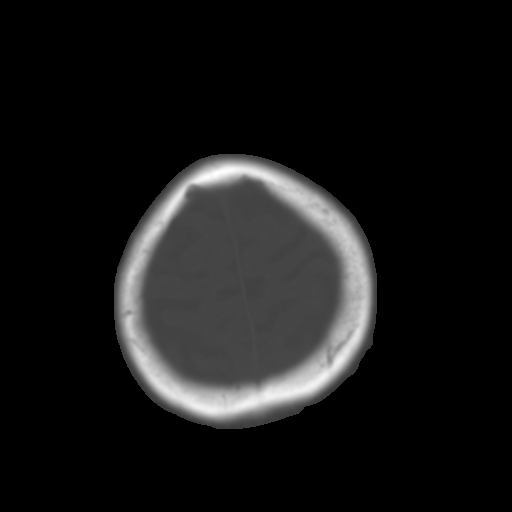
[im 28/31  brain]
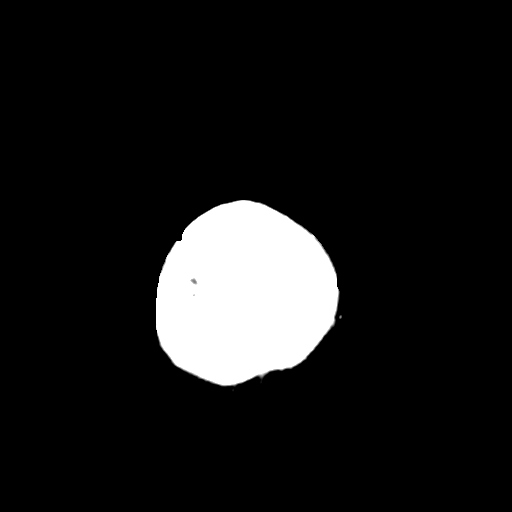

[Series 4: coronal soft tissue · coronal · 0.30mm/px · 3 of 64 slices shown]
[im 22/64  brain]
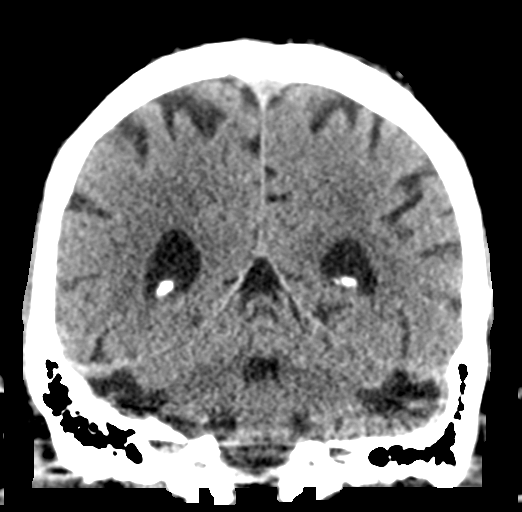
[im 29/64  brain]
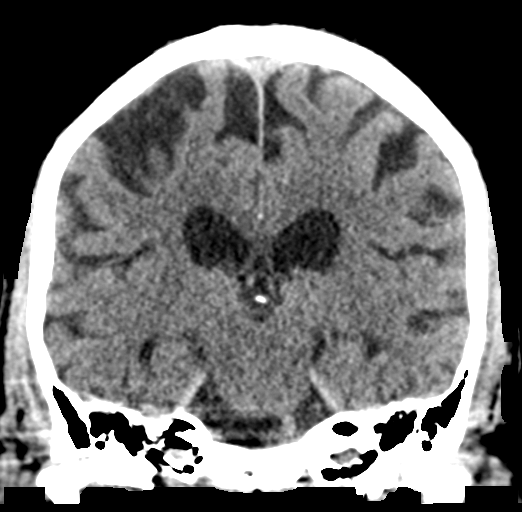
[im 36/64  brain]
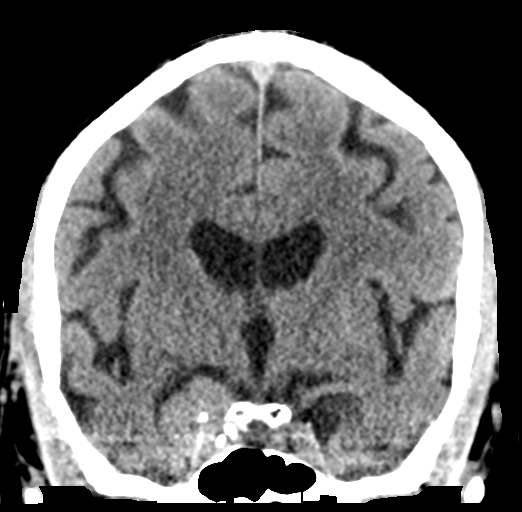

[Series 5: sagittal soft tissue · sagittal · 0.30mm/px · 3 of 53 slices shown]
[im 18/53  brain]
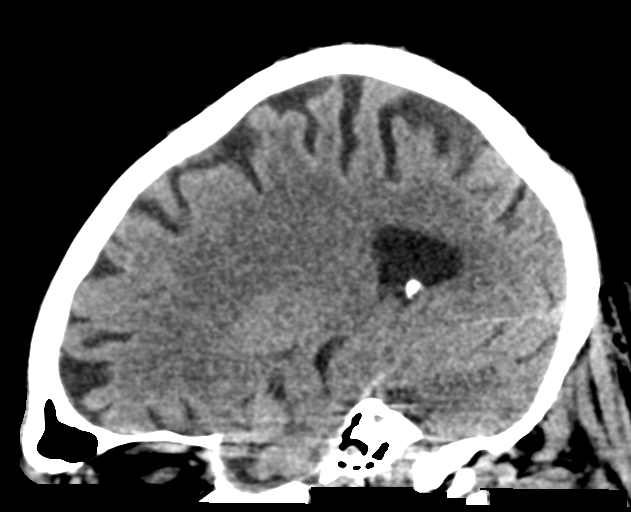
[im 27/53  brain]
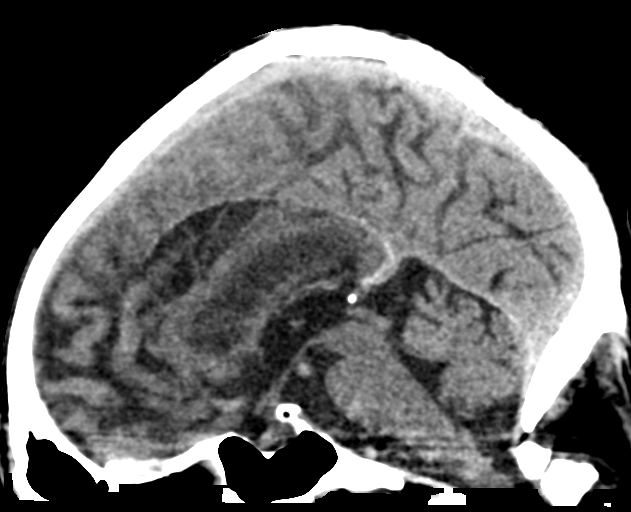
[im 35/53  brain]
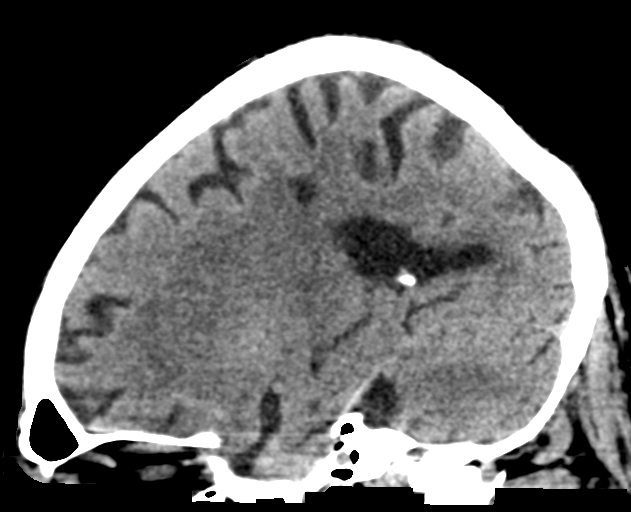

[16 of 47 positions shown; findings below may reference images not displayed]

FINDINGS: Brain: No evidence of acute territorial infarction, hemorrhage,
hydrocephalus,extra-axial collection or mass lesion/mass effect.
There is dilatation the ventricles and sulci consistent with
age-related atrophy. Low-attenuation changes in the deep white
matter consistent with small vessel ischemia.

Vascular: No hyperdense vessel or unexpected calcification.

Skull: The skull is intact. No fracture or focal lesion identified.

Sinuses/Orbits: The visualized paranasal sinuses and mastoid air
cells are clear. The orbits and globes intact.

Other: Question of mild soft tissue swelling seen over the left
frontal lobe.
IMPRESSION: No acute intracranial abnormality.

Findings consistent with age related atrophy and chronic small
vessel ischemia
